# Patient Record
Sex: Female | Born: 1945 | Race: White | Hispanic: No | Marital: Married | Smoking: Former smoker
Health system: Southern US, Community
[De-identification: ages and names within clinical notes are randomized; demographics above are authoritative.]

## PROBLEM LIST (undated history)

## (undated) DIAGNOSIS — K219 Gastro-esophageal reflux disease without esophagitis: Secondary | ICD-10-CM

## (undated) DIAGNOSIS — N6310 Unspecified lump in the right breast, unspecified quadrant: Secondary | ICD-10-CM

## (undated) DIAGNOSIS — E789 Disorder of lipoprotein metabolism, unspecified: Secondary | ICD-10-CM

## (undated) DIAGNOSIS — E785 Hyperlipidemia, unspecified: Secondary | ICD-10-CM

## (undated) DIAGNOSIS — I10 Essential (primary) hypertension: Secondary | ICD-10-CM

## (undated) DIAGNOSIS — G2581 Restless legs syndrome: Secondary | ICD-10-CM

## (undated) DIAGNOSIS — F419 Anxiety disorder, unspecified: Secondary | ICD-10-CM

## (undated) DIAGNOSIS — R928 Other abnormal and inconclusive findings on diagnostic imaging of breast: Secondary | ICD-10-CM

## (undated) HISTORY — DX: Hyperlipidemia, unspecified: E78.5

## (undated) HISTORY — PX: TONSILLECTOMY: SUR1361

## (undated) HISTORY — PX: ABDOMINAL HYSTERECTOMY: SHX81

---

## 1993-07-19 HISTORY — PX: BREAST EXCISIONAL BIOPSY: SUR124

## 1998-10-17 ENCOUNTER — Other Ambulatory Visit: Admission: RE | Admit: 1998-10-17 | Discharge: 1998-10-17 | Payer: Self-pay | Admitting: *Deleted

## 1998-10-29 ENCOUNTER — Other Ambulatory Visit: Admission: RE | Admit: 1998-10-29 | Discharge: 1998-10-29 | Payer: Self-pay | Admitting: *Deleted

## 1999-04-23 ENCOUNTER — Ambulatory Visit (HOSPITAL_COMMUNITY): Admission: RE | Admit: 1999-04-23 | Discharge: 1999-04-23 | Payer: Self-pay | Admitting: *Deleted

## 1999-06-09 ENCOUNTER — Ambulatory Visit (HOSPITAL_COMMUNITY): Admission: RE | Admit: 1999-06-09 | Discharge: 1999-06-09 | Payer: Self-pay | Admitting: Gastroenterology

## 1999-06-09 ENCOUNTER — Encounter (INDEPENDENT_AMBULATORY_CARE_PROVIDER_SITE_OTHER): Payer: Self-pay | Admitting: *Deleted

## 2000-01-19 ENCOUNTER — Other Ambulatory Visit: Admission: RE | Admit: 2000-01-19 | Discharge: 2000-01-19 | Payer: Self-pay | Admitting: *Deleted

## 2001-04-17 ENCOUNTER — Ambulatory Visit (HOSPITAL_COMMUNITY): Admission: RE | Admit: 2001-04-17 | Discharge: 2001-04-17 | Payer: Self-pay | Admitting: Internal Medicine

## 2001-04-17 ENCOUNTER — Encounter: Payer: Self-pay | Admitting: Internal Medicine

## 2004-01-01 ENCOUNTER — Ambulatory Visit (HOSPITAL_COMMUNITY): Admission: RE | Admit: 2004-01-01 | Discharge: 2004-01-01 | Payer: Self-pay | Admitting: Internal Medicine

## 2004-10-16 ENCOUNTER — Encounter (INDEPENDENT_AMBULATORY_CARE_PROVIDER_SITE_OTHER): Payer: Self-pay | Admitting: *Deleted

## 2004-10-16 ENCOUNTER — Ambulatory Visit (HOSPITAL_COMMUNITY): Admission: RE | Admit: 2004-10-16 | Discharge: 2004-10-16 | Payer: Self-pay | Admitting: Gastroenterology

## 2006-09-08 ENCOUNTER — Other Ambulatory Visit: Admission: RE | Admit: 2006-09-08 | Discharge: 2006-09-08 | Payer: Self-pay | Admitting: Family Medicine

## 2009-11-20 ENCOUNTER — Other Ambulatory Visit: Admission: RE | Admit: 2009-11-20 | Discharge: 2009-11-20 | Payer: Self-pay | Admitting: Internal Medicine

## 2009-11-20 ENCOUNTER — Ambulatory Visit (HOSPITAL_COMMUNITY): Admission: RE | Admit: 2009-11-20 | Discharge: 2009-11-20 | Payer: Self-pay | Admitting: Internal Medicine

## 2009-11-20 ENCOUNTER — Encounter: Payer: Self-pay | Admitting: Cardiology

## 2009-12-12 ENCOUNTER — Ambulatory Visit: Payer: Self-pay | Admitting: Cardiology

## 2009-12-12 DIAGNOSIS — E782 Mixed hyperlipidemia: Secondary | ICD-10-CM | POA: Insufficient documentation

## 2009-12-12 DIAGNOSIS — R0989 Other specified symptoms and signs involving the circulatory and respiratory systems: Secondary | ICD-10-CM

## 2009-12-12 DIAGNOSIS — R0609 Other forms of dyspnea: Secondary | ICD-10-CM

## 2009-12-12 DIAGNOSIS — R5383 Other fatigue: Secondary | ICD-10-CM

## 2009-12-24 ENCOUNTER — Encounter: Payer: Self-pay | Admitting: Cardiology

## 2009-12-29 ENCOUNTER — Telehealth: Payer: Self-pay | Admitting: Cardiology

## 2010-01-22 ENCOUNTER — Ambulatory Visit: Payer: Self-pay | Admitting: Cardiology

## 2010-08-08 ENCOUNTER — Encounter: Payer: Self-pay | Admitting: Internal Medicine

## 2010-08-20 NOTE — Letter (Signed)
[facility]   Imported By: Marylou Mccoy 01/10/2010 09:29:42  _____________________________________________________________________  External Attachment:    Type:   Image     Comment:   External Document

## 2010-08-20 NOTE — Assessment & Plan Note (Signed)
Summary: np6/dx: eval for stress test or treadmill for SOB/lg   Referring Provider:  Loree Fee Primary Provider:  Dr. Oneta Rack  CC:  pt reports fatigue and a lot of stress.  Father has dementia and brother living with father is an alcoholic so patient is having to manage two households.  Pt states she has not taken her bp meds today.  Marland Kitchen  History of Present Illness: 65 yo with history of HTN and hyperlipidemia presents for evaluation of fatigue and abnormal ECG.  She has been significantly fatigued in the afternoon for the last 2 years.  She had some atypical chest heaviness last year but has not had any recent chest pain or heaviness.  She did have an episode of left arm pain that occurred while sitting down about 2 weeks ago.  This has not recurred.  Her main complaint is really just the fatigue.  She does not get short of breath with exertion in particular.  She golfs and gardens.  No palpitations.  She has had a number of stressful issues going on in her family recently.  Her BP is 154/90 today but she has not taken any of her BP medications.    ECG: NSR at 52 with incomplete RBBB  Labs (5/11): HCT 37.9, K 4.5, creatinine 0.94, LDL 86, HDL 51, TGs 346 (but had eaten before labs were drawn), TSH normal  Current Medications (verified): 1)  Ibuprofen 800 Mg Tabs (Ibuprofen) .... As Needed For Pain 2)  Crestor 20 Mg Tabs (Rosuvastatin Calcium) .... Take One Half Tablet Every Evening 3)  Toprol Xl 100 Mg Xr24h-Tab (Metoprolol Succinate) .... Take One Half Tablet Once Daily 4)  Lorazepam 1 Mg Tabs (Lorazepam) .... As Needed For Anxiety 5)  Triamterene-Hctz 75-50 Mg Tabs (Triamterene-Hctz) .... One Tablet Daily 6)  Noritate 1 % Crea (Metronidazole) .... As Needed 7)  Lunesta 2 Mg Tabs (Eszopiclone) .... Take As Needed For Sleep 8)  Hyoscyamine Sulfate 0.125 Mg/65ml Elix (Hyoscyamine Sulfate) .... As Needed 9)  Citalopram Hydrobromide 20 Mg Tabs (Citalopram Hydrobromide) .... Take One Tablet  Once Daily 10)  Multivitamins  Tabs (Multiple Vitamin) .... Once Daily 11)  Vitamin D3 1000 Unit Tabs (Cholecalciferol) .... Take One Tablet Once Daily 12)  Fish Oil Maximum Strength 1200 Mg Caps (Omega-3 Fatty Acids) .... Once Daily 13)  Magnesium 500 Mg Tabs (Magnesium) .... Once Daily 14)  Aspirin 81 Mg Tabs (Aspirin) .... Once Daily 15)  Pepcid Ac Maximum Strength 20 Mg Tabs (Famotidine) .... As Needed 16)  Dexilant 60 Mg Cpdr (Dexlansoprazole) .... Once Daily 17)  Cetirizine Hcl 10 Mg Tabs (Cetirizine Hcl) .... Take One Tablet Once Daily  Allergies (verified): No Known Drug Allergies  Past History:  Past Medical History: 1. Hyperlipidemia 2. HTN 3. GERD 4. IBS 5. Depression 6. Fatigue  Family History: Mother died at 31 of breast cancer 2 uncles with CABG in their 51s-70s  Social History: Married, lives in [place], retired, nonsmoker, rare ETOH  Review of Systems       All systems reviewed and negative except as per HPI.   Vital Signs:  Patient profile:   65 year old female Height:      64 inches Weight:      172 pounds BMI:     29.63 Pulse rate:   52 / minute Pulse rhythm:   regular BP sitting:   154 / 90  (left arm) Cuff size:   large  Vitals Entered By: Judithe Modest CMA (Dec 12, 2009 9:08 AM)  Physical Exam  General:  Well developed, well nourished, in no acute distress. Head:  normocephalic and atraumatic Nose:  no deformity, discharge, inflammation, or lesions Mouth:  Teeth, gums and palate normal. Oral mucosa normal. Neck:  Neck supple, no JVD. No masses, thyromegaly or abnormal cervical nodes. Lungs:  Clear bilaterally to auscultation and percussion. Heart:  Non-displaced PMI, chest non-tender; regular rate and rhythm, S1, S2 without murmurs, rubs or gallops. Carotid upstroke normal, no bruit. Pedals normal pulses. No edema, no varicosities. Abdomen:  Bowel sounds positive; abdomen soft and non-tender without masses, organomegaly, or hernias noted.  No hepatosplenomegaly. Msk:  Back normal, normal gait. Muscle strength and tone normal. Extremities:  No clubbing or cyanosis. Neurologic:  Alert and oriented x 3. Skin:  Intact without lesions or rashes. Psych:  Normal affect.   Impression & Recommendations:  Problem # 1:  FATIGUE (ICD-780.79) Patient reports significant fatigue in the afternoon for about 2 years.  No chest pain or pressure, no significant dyspnea.  Her TSH and HCT were normal.  She does have some cardiac risk factors (HTN and hyperlipidemia).  I will have her do an ETT to assess for ischemia as a cause of her symptoms.  She does report significant snoring at night, so I also recommended to her that she get a sleep study (I will set this up).   Problem # 2:  HYPERLIPIDEMIA-MIXED (ICD-272.4) Triglycerides were high but she had eaten before the last labs were done.  LDL and HDL at goal on Crestor.  I asked her to increase her fish oil to 2000 mg daily.   Other Orders: Misc. Referral (Misc. Ref) Treadmill (Treadmill)  Patient Instructions: 1)  Your physician has requested that you have an exercise tolerance test.  For further information please visit [email or web address]  Please also follow instruction sheet, as given. 2)  Your physician has recommended that you have a sleep study.  This test records several body functions during sleep, including:  brain activity, eye movement, oxygen and carbon dioxide blood levels, heart rate and rhythm, breathing rate and rhythm, the flow of air through your mouth and nose, snoring, body muscle movements, and chest and belly movement. 3)  Increase Fish Oil to one capsule twice a day.

## 2010-08-20 NOTE — Progress Notes (Signed)
Summary: decided not to have sleep study  ---- Converted from flag ---- ---- 12/25/2009 8:50 AM, Merita Norton Lloyd-Fate wrote: Pt has decided not to have sleep study [facility] Lloyd-Fate  December 25, 2009 8:47 AM ------------------------------

## 2010-08-20 NOTE — Progress Notes (Signed)
----   Converted from flag ---- ---- 12/23/2009 11:24 AM, Merita Norton Lloyd-Fate wrote: Pt has not returned any the sleep center phone calls per Denee. Merita Norton Lloyd-Fate  December 23, 2009 11:24 AM ------------------------------

## 2010-08-20 NOTE — Letter (Signed)
Summary: [facility]olescent Wellness Visit   [facility] Visit   Imported By: Roderic Ovens 01/22/2010 13:44:28  _____________________________________________________________________  External Attachment:    Type:   Image     Comment:   External Document

## 2010-12-04 NOTE — Op Note (Signed)
[name], [name]              ACCOUNT NO.:  [id number]   MEDICAL RECORD NO.:  [id number]          PATIENT TYPE:  AMB   LOCATION:  ENDO                         FACILITY:  [facility]   PHYSICIAN:  Petra Kuba, M.D.    DATE OF BIRTH:  February 21, 1946   DATE OF PROCEDURE:  10/16/2004  DATE OF DISCHARGE:                                 OPERATIVE REPORT   PROCEDURE:  Colonoscopy with hot biopsy.   INDICATIONS:  History of colon polyps.  Due for repeat screening.   Consent was signed after risks, benefits, methods, and options thoroughly  discussed multiple times in the past.   MEDICINES USED:  Demerol 70, Versed 9.   PROCEDURE:  Rectal inspection is pertinent for external hemorrhoids, small.  Digital exam was negative.  The video colonoscope was inserted and easily  able to be advanced around the colon to the cecum.  This did not require any  abdominal pressure or position changes.  No abnormalities was seen on  insertion.  The cecum was identified by the appendiceal orifice and the  ileocecal valve.  The scope inserted shortways into the terminal ileum,  which was normal.  Photo documentation was obtained.  The scope was slowly  withdrawn.  The prep was adequate.  There was minimal liquid stool that  required washing and suctioning.  On slow withdrawal back to the distal  sigmoid, no abnormalities were seen.  Specifically, no polyps, tumors,  masses, or diverticula.  In the distal sigmoid and rectum, two tiny polyps  were seen. Both were hot-biopsied and put in the same container.  Anorectal  pull-through and retroflexion confirmed some tiny hemorrhoids.  The scope  was straightened and readvanced shortways up the left side of the colon.  Air was suctioned.  The scope removed.  The patient tolerated the procedure  well.  There was no obvious immediate complication.   ENDOSCOPIC DIAGNOSES:  1.  Internal/external hemorrhoids.  2.  Two tiny rectal distal polyps, hot biopsied.  3.   Otherwise within normal limits in the cecum and terminal ileum.   PLAN:  Await pathology.  Probably recheck colon screening in five years.  Happy to see back p.r.n.  Otherwise yearly rectals and guaiacs per Dr.  Oneta Rack.     MEM/MEDQ  D:  10/16/2004  T:  10/16/2004  Job:  [id number]   cc:   Lucky Cowboy, M.D.  [address], [address]  [place], [place] [address]  Fax: [phone]

## 2010-12-04 NOTE — Procedures (Signed)
[place] [place] [facility]  Patient:    Tara [name]                      MRN: [id number] Proc. Date: 06/09/99 Adm. Date:  [id number] Attending:  Nelda Marseille CC:         Petra Kuba, M.D.             Amil Amen L. Lenon Ahmadi, M.D.                           Procedure Report  PROCEDURE:  Colonoscopy with hot biopsy.  INDICATIONS:  Patient with history of colon polyps due for repeat screening. Consent was signed after risks, benefits, methods and options thoroughly discussed multiple times in the past.  MEDICATIONS:  Demerol 75, Versed 7.5.  PROCEDURE:  Rectal inspection pertinent for small external hemorrhoids, small.  Digital examination was negative.  The video colonoscope was inserted and easily advanced around the colon to the cecum.  Cecum was identified by the appendiceal orifice and ileocecal valve.  In fact, the scope inserted a short ways into the  terminal ileum, which was normal.  Photo documentation was obtained.  The scope is slowly withdrawn.  Prep was adequate.  There was some liquid stool that required washing and suctioning.  On slow withdrawal through the colon, four tiny polyps  were seen and all hot biopsied x 1 and they were in the ascending, splenic flexure, descending and sigmoid.  No other abnormalities were seen.  Specifically, no diverticuli, signs of colitis or other worrisome lesions.  Back in the rectum, he scope is retroflexed, pertinent for some internal hemorrhoids.  Scope was drained, readvanced a short ways up the sigmoid, air was suctioned, scope was slowly withdrawn.  The patient tolerated the procedure well.  There were no obvious immediate complication.  ENDOSCOPIC DIAGNOSIS: 1. Internal and external hemorrhoids. 2. Four tiny polyps, all hot biopsied in the sigmoid, descending, splenic flexure    and ascending. 3. Otherwise within normal limits to the terminal ileum.  PLAN:  Await pathology to determine  future colonic screening. GI follow-up p.r.n., particularly if irritable bowel symptoms increase, otherwise yearly rectals and  guaiacs per Dr. Lenon Ahmadi. DD:  06/09/99 TD:  06/10/99 Job: [id number]

## 2011-08-26 DIAGNOSIS — J069 Acute upper respiratory infection, unspecified: Secondary | ICD-10-CM | POA: Diagnosis not present

## 2011-09-06 DIAGNOSIS — B0233 Zoster keratitis: Secondary | ICD-10-CM | POA: Diagnosis not present

## 2011-09-28 DIAGNOSIS — M25559 Pain in unspecified hip: Secondary | ICD-10-CM | POA: Diagnosis not present

## 2011-09-28 DIAGNOSIS — M25519 Pain in unspecified shoulder: Secondary | ICD-10-CM | POA: Diagnosis not present

## 2011-09-28 DIAGNOSIS — M25549 Pain in joints of unspecified hand: Secondary | ICD-10-CM | POA: Diagnosis not present

## 2011-11-15 DIAGNOSIS — Z09 Encounter for follow-up examination after completed treatment for conditions other than malignant neoplasm: Secondary | ICD-10-CM | POA: Diagnosis not present

## 2011-11-15 DIAGNOSIS — N6009 Solitary cyst of unspecified breast: Secondary | ICD-10-CM | POA: Diagnosis not present

## 2012-04-25 DIAGNOSIS — Z23 Encounter for immunization: Secondary | ICD-10-CM | POA: Diagnosis not present

## 2012-04-25 DIAGNOSIS — Z79899 Other long term (current) drug therapy: Secondary | ICD-10-CM | POA: Diagnosis not present

## 2012-04-25 DIAGNOSIS — E785 Hyperlipidemia, unspecified: Secondary | ICD-10-CM | POA: Diagnosis not present

## 2012-06-12 DIAGNOSIS — D2370 Other benign neoplasm of skin of unspecified lower limb, including hip: Secondary | ICD-10-CM | POA: Diagnosis not present

## 2012-06-12 DIAGNOSIS — D1801 Hemangioma of skin and subcutaneous tissue: Secondary | ICD-10-CM | POA: Diagnosis not present

## 2012-06-12 DIAGNOSIS — L821 Other seborrheic keratosis: Secondary | ICD-10-CM | POA: Diagnosis not present

## 2012-08-25 DIAGNOSIS — E785 Hyperlipidemia, unspecified: Secondary | ICD-10-CM | POA: Diagnosis not present

## 2012-10-24 DIAGNOSIS — I10 Essential (primary) hypertension: Secondary | ICD-10-CM | POA: Diagnosis not present

## 2013-01-16 DIAGNOSIS — N6019 Diffuse cystic mastopathy of unspecified breast: Secondary | ICD-10-CM | POA: Diagnosis not present

## 2013-02-05 DIAGNOSIS — H2510 Age-related nuclear cataract, unspecified eye: Secondary | ICD-10-CM | POA: Diagnosis not present

## 2013-03-15 DIAGNOSIS — J019 Acute sinusitis, unspecified: Secondary | ICD-10-CM | POA: Diagnosis not present

## 2013-06-28 DIAGNOSIS — I10 Essential (primary) hypertension: Secondary | ICD-10-CM | POA: Diagnosis not present

## 2013-06-28 DIAGNOSIS — Z23 Encounter for immunization: Secondary | ICD-10-CM | POA: Diagnosis not present

## 2013-10-05 DIAGNOSIS — N3942 Incontinence without sensory awareness: Secondary | ICD-10-CM | POA: Diagnosis not present

## 2013-10-05 DIAGNOSIS — N3946 Mixed incontinence: Secondary | ICD-10-CM | POA: Diagnosis not present

## 2013-10-05 DIAGNOSIS — R351 Nocturia: Secondary | ICD-10-CM | POA: Diagnosis not present

## 2013-11-13 DIAGNOSIS — N3946 Mixed incontinence: Secondary | ICD-10-CM | POA: Diagnosis not present

## 2013-11-13 DIAGNOSIS — N3942 Incontinence without sensory awareness: Secondary | ICD-10-CM | POA: Diagnosis not present

## 2013-11-20 DIAGNOSIS — F411 Generalized anxiety disorder: Secondary | ICD-10-CM | POA: Diagnosis not present

## 2013-11-20 DIAGNOSIS — R609 Edema, unspecified: Secondary | ICD-10-CM | POA: Diagnosis not present

## 2013-11-20 DIAGNOSIS — N318 Other neuromuscular dysfunction of bladder: Secondary | ICD-10-CM | POA: Diagnosis not present

## 2013-11-20 DIAGNOSIS — K219 Gastro-esophageal reflux disease without esophagitis: Secondary | ICD-10-CM | POA: Diagnosis not present

## 2013-11-20 DIAGNOSIS — G2581 Restless legs syndrome: Secondary | ICD-10-CM | POA: Diagnosis not present

## 2013-11-20 DIAGNOSIS — I10 Essential (primary) hypertension: Secondary | ICD-10-CM | POA: Diagnosis not present

## 2013-11-20 DIAGNOSIS — E785 Hyperlipidemia, unspecified: Secondary | ICD-10-CM | POA: Diagnosis not present

## 2013-11-20 DIAGNOSIS — Z23 Encounter for immunization: Secondary | ICD-10-CM | POA: Diagnosis not present

## 2014-01-14 DIAGNOSIS — E785 Hyperlipidemia, unspecified: Secondary | ICD-10-CM | POA: Diagnosis not present

## 2014-01-14 DIAGNOSIS — G2581 Restless legs syndrome: Secondary | ICD-10-CM | POA: Diagnosis not present

## 2014-01-14 DIAGNOSIS — F341 Dysthymic disorder: Secondary | ICD-10-CM | POA: Diagnosis not present

## 2014-01-14 DIAGNOSIS — I10 Essential (primary) hypertension: Secondary | ICD-10-CM | POA: Diagnosis not present

## 2014-03-21 DIAGNOSIS — Z1231 Encounter for screening mammogram for malignant neoplasm of breast: Secondary | ICD-10-CM | POA: Diagnosis not present

## 2014-03-21 DIAGNOSIS — Z803 Family history of malignant neoplasm of breast: Secondary | ICD-10-CM | POA: Diagnosis not present

## 2014-04-18 DIAGNOSIS — H2513 Age-related nuclear cataract, bilateral: Secondary | ICD-10-CM | POA: Diagnosis not present

## 2014-04-22 DIAGNOSIS — Z23 Encounter for immunization: Secondary | ICD-10-CM | POA: Diagnosis not present

## 2014-04-23 DIAGNOSIS — Z23 Encounter for immunization: Secondary | ICD-10-CM | POA: Diagnosis not present

## 2014-05-21 DIAGNOSIS — Z23 Encounter for immunization: Secondary | ICD-10-CM | POA: Diagnosis not present

## 2014-05-21 DIAGNOSIS — E6609 Other obesity due to excess calories: Secondary | ICD-10-CM | POA: Diagnosis not present

## 2014-05-21 DIAGNOSIS — I10 Essential (primary) hypertension: Secondary | ICD-10-CM | POA: Diagnosis not present

## 2014-05-21 DIAGNOSIS — E785 Hyperlipidemia, unspecified: Secondary | ICD-10-CM | POA: Diagnosis not present

## 2014-05-21 DIAGNOSIS — G2581 Restless legs syndrome: Secondary | ICD-10-CM | POA: Diagnosis not present

## 2014-05-21 DIAGNOSIS — J309 Allergic rhinitis, unspecified: Secondary | ICD-10-CM | POA: Diagnosis not present

## 2014-05-21 DIAGNOSIS — F324 Major depressive disorder, single episode, in partial remission: Secondary | ICD-10-CM | POA: Diagnosis not present

## 2014-05-21 DIAGNOSIS — F419 Anxiety disorder, unspecified: Secondary | ICD-10-CM | POA: Diagnosis not present

## 2014-08-15 DIAGNOSIS — H2513 Age-related nuclear cataract, bilateral: Secondary | ICD-10-CM | POA: Diagnosis not present

## 2014-08-15 DIAGNOSIS — H2512 Age-related nuclear cataract, left eye: Secondary | ICD-10-CM | POA: Diagnosis not present

## 2014-08-21 DIAGNOSIS — H2512 Age-related nuclear cataract, left eye: Secondary | ICD-10-CM | POA: Diagnosis not present

## 2014-10-08 DIAGNOSIS — L919 Hypertrophic disorder of the skin, unspecified: Secondary | ICD-10-CM | POA: Diagnosis not present

## 2014-10-08 DIAGNOSIS — L718 Other rosacea: Secondary | ICD-10-CM | POA: Diagnosis not present

## 2014-10-08 DIAGNOSIS — L821 Other seborrheic keratosis: Secondary | ICD-10-CM | POA: Diagnosis not present

## 2014-10-08 DIAGNOSIS — L820 Inflamed seborrheic keratosis: Secondary | ICD-10-CM | POA: Diagnosis not present

## 2014-10-08 DIAGNOSIS — D1801 Hemangioma of skin and subcutaneous tissue: Secondary | ICD-10-CM | POA: Diagnosis not present

## 2014-10-10 DIAGNOSIS — E785 Hyperlipidemia, unspecified: Secondary | ICD-10-CM | POA: Diagnosis not present

## 2014-10-10 DIAGNOSIS — E6609 Other obesity due to excess calories: Secondary | ICD-10-CM | POA: Diagnosis not present

## 2014-10-10 DIAGNOSIS — G2581 Restless legs syndrome: Secondary | ICD-10-CM | POA: Diagnosis not present

## 2014-10-10 DIAGNOSIS — F419 Anxiety disorder, unspecified: Secondary | ICD-10-CM | POA: Diagnosis not present

## 2014-10-10 DIAGNOSIS — Z6833 Body mass index (BMI) 33.0-33.9, adult: Secondary | ICD-10-CM | POA: Diagnosis not present

## 2014-10-10 DIAGNOSIS — R3915 Urgency of urination: Secondary | ICD-10-CM | POA: Diagnosis not present

## 2014-10-10 DIAGNOSIS — I10 Essential (primary) hypertension: Secondary | ICD-10-CM | POA: Diagnosis not present

## 2014-10-10 DIAGNOSIS — F324 Major depressive disorder, single episode, in partial remission: Secondary | ICD-10-CM | POA: Diagnosis not present

## 2014-10-10 DIAGNOSIS — Z0000 Encounter for general adult medical examination without abnormal findings: Secondary | ICD-10-CM | POA: Diagnosis not present

## 2014-10-21 DIAGNOSIS — S60211A Contusion of right wrist, initial encounter: Secondary | ICD-10-CM | POA: Diagnosis not present

## 2015-02-14 DIAGNOSIS — R42 Dizziness and giddiness: Secondary | ICD-10-CM | POA: Diagnosis not present

## 2015-02-14 DIAGNOSIS — R12 Heartburn: Secondary | ICD-10-CM | POA: Diagnosis not present

## 2015-02-14 DIAGNOSIS — R0602 Shortness of breath: Secondary | ICD-10-CM | POA: Diagnosis not present

## 2015-02-14 DIAGNOSIS — I10 Essential (primary) hypertension: Secondary | ICD-10-CM | POA: Diagnosis not present

## 2015-04-10 DIAGNOSIS — Z961 Presence of intraocular lens: Secondary | ICD-10-CM | POA: Diagnosis not present

## 2015-04-14 DIAGNOSIS — F419 Anxiety disorder, unspecified: Secondary | ICD-10-CM | POA: Diagnosis not present

## 2015-04-14 DIAGNOSIS — F324 Major depressive disorder, single episode, in partial remission: Secondary | ICD-10-CM | POA: Diagnosis not present

## 2015-04-14 DIAGNOSIS — N3281 Overactive bladder: Secondary | ICD-10-CM | POA: Diagnosis not present

## 2015-04-14 DIAGNOSIS — I10 Essential (primary) hypertension: Secondary | ICD-10-CM | POA: Diagnosis not present

## 2015-04-14 DIAGNOSIS — N898 Other specified noninflammatory disorders of vagina: Secondary | ICD-10-CM | POA: Diagnosis not present

## 2015-04-14 DIAGNOSIS — E785 Hyperlipidemia, unspecified: Secondary | ICD-10-CM | POA: Diagnosis not present

## 2015-04-14 DIAGNOSIS — E6609 Other obesity due to excess calories: Secondary | ICD-10-CM | POA: Diagnosis not present

## 2015-04-14 DIAGNOSIS — M549 Dorsalgia, unspecified: Secondary | ICD-10-CM | POA: Diagnosis not present

## 2015-04-14 DIAGNOSIS — Z23 Encounter for immunization: Secondary | ICD-10-CM | POA: Diagnosis not present

## 2015-06-07 DIAGNOSIS — Z803 Family history of malignant neoplasm of breast: Secondary | ICD-10-CM | POA: Diagnosis not present

## 2015-06-07 DIAGNOSIS — Z1231 Encounter for screening mammogram for malignant neoplasm of breast: Secondary | ICD-10-CM | POA: Diagnosis not present

## 2015-06-11 DIAGNOSIS — R922 Inconclusive mammogram: Secondary | ICD-10-CM | POA: Diagnosis not present

## 2015-06-11 DIAGNOSIS — R921 Mammographic calcification found on diagnostic imaging of breast: Secondary | ICD-10-CM | POA: Diagnosis not present

## 2015-06-11 DIAGNOSIS — Z1231 Encounter for screening mammogram for malignant neoplasm of breast: Secondary | ICD-10-CM | POA: Diagnosis not present

## 2015-06-11 DIAGNOSIS — Z803 Family history of malignant neoplasm of breast: Secondary | ICD-10-CM | POA: Diagnosis not present

## 2015-10-24 ENCOUNTER — Other Ambulatory Visit: Payer: Self-pay | Admitting: Gastroenterology

## 2015-10-24 DIAGNOSIS — K621 Rectal polyp: Secondary | ICD-10-CM | POA: Diagnosis not present

## 2015-10-24 DIAGNOSIS — K635 Polyp of colon: Secondary | ICD-10-CM | POA: Diagnosis not present

## 2015-10-24 DIAGNOSIS — D120 Benign neoplasm of cecum: Secondary | ICD-10-CM | POA: Diagnosis not present

## 2015-10-24 DIAGNOSIS — D122 Benign neoplasm of ascending colon: Secondary | ICD-10-CM | POA: Diagnosis not present

## 2015-10-24 DIAGNOSIS — Z86010 Personal history of colonic polyps: Secondary | ICD-10-CM | POA: Diagnosis not present

## 2015-10-24 DIAGNOSIS — D125 Benign neoplasm of sigmoid colon: Secondary | ICD-10-CM | POA: Diagnosis not present

## 2015-11-13 DIAGNOSIS — Z0000 Encounter for general adult medical examination without abnormal findings: Secondary | ICD-10-CM | POA: Diagnosis not present

## 2015-11-13 DIAGNOSIS — Z1159 Encounter for screening for other viral diseases: Secondary | ICD-10-CM | POA: Diagnosis not present

## 2015-11-13 DIAGNOSIS — M546 Pain in thoracic spine: Secondary | ICD-10-CM | POA: Diagnosis not present

## 2015-11-13 DIAGNOSIS — R5383 Other fatigue: Secondary | ICD-10-CM | POA: Diagnosis not present

## 2015-11-13 DIAGNOSIS — I10 Essential (primary) hypertension: Secondary | ICD-10-CM | POA: Diagnosis not present

## 2015-11-13 DIAGNOSIS — Z13820 Encounter for screening for osteoporosis: Secondary | ICD-10-CM | POA: Diagnosis not present

## 2015-11-13 DIAGNOSIS — E785 Hyperlipidemia, unspecified: Secondary | ICD-10-CM | POA: Diagnosis not present

## 2015-11-13 DIAGNOSIS — F324 Major depressive disorder, single episode, in partial remission: Secondary | ICD-10-CM | POA: Diagnosis not present

## 2015-11-13 DIAGNOSIS — G2581 Restless legs syndrome: Secondary | ICD-10-CM | POA: Diagnosis not present

## 2015-11-19 DIAGNOSIS — S30860A Insect bite (nonvenomous) of lower back and pelvis, initial encounter: Secondary | ICD-10-CM | POA: Diagnosis not present

## 2015-11-19 DIAGNOSIS — R5381 Other malaise: Secondary | ICD-10-CM | POA: Diagnosis not present

## 2015-11-19 DIAGNOSIS — S2096XA Insect bite (nonvenomous) of unspecified parts of thorax, initial encounter: Secondary | ICD-10-CM | POA: Diagnosis not present

## 2015-11-19 LAB — CBC AND DIFFERENTIAL
HEMATOCRIT: 40 (ref 36–46)
Hemoglobin: 13 (ref 12.0–16.0)
Neutrophils Absolute: 3
Platelets: 198 (ref 150–399)
WBC: 5.9

## 2016-02-12 DIAGNOSIS — E785 Hyperlipidemia, unspecified: Secondary | ICD-10-CM | POA: Diagnosis not present

## 2016-02-12 DIAGNOSIS — Z0000 Encounter for general adult medical examination without abnormal findings: Secondary | ICD-10-CM | POA: Diagnosis not present

## 2016-02-12 LAB — LIPID PANEL
Cholesterol: 217 — AB (ref 0–200)
HDL: 53 (ref 35–70)
LDL CALC: 118
Triglycerides: 229 — AB (ref 40–160)

## 2016-02-12 LAB — BASIC METABOLIC PANEL
BUN: 16 (ref 4–21)
Creatinine: 0.9 (ref 0.5–1.1)
GLUCOSE: 93
Potassium: 4.5 (ref 3.4–5.3)
SODIUM: 140 (ref 137–147)

## 2016-02-12 LAB — TSH: TSH: 1.77 (ref 0.41–5.90)

## 2016-02-12 LAB — HEPATIC FUNCTION PANEL
ALK PHOS: 76 (ref 25–125)
ALT: 23 (ref 7–35)
AST: 18 (ref 13–35)
BILIRUBIN, TOTAL: 0.4

## 2016-02-17 ENCOUNTER — Other Ambulatory Visit: Payer: Self-pay | Admitting: Family Medicine

## 2016-02-17 DIAGNOSIS — E2839 Other primary ovarian failure: Secondary | ICD-10-CM

## 2016-03-02 ENCOUNTER — Other Ambulatory Visit: Payer: Self-pay | Admitting: Family Medicine

## 2016-03-02 DIAGNOSIS — N6002 Solitary cyst of left breast: Secondary | ICD-10-CM

## 2016-03-08 ENCOUNTER — Other Ambulatory Visit: Payer: Self-pay

## 2016-03-08 ENCOUNTER — Other Ambulatory Visit: Payer: Self-pay | Admitting: Family Medicine

## 2016-03-08 DIAGNOSIS — E2839 Other primary ovarian failure: Secondary | ICD-10-CM

## 2016-03-08 DIAGNOSIS — N6002 Solitary cyst of left breast: Secondary | ICD-10-CM

## 2016-03-09 ENCOUNTER — Ambulatory Visit
Admission: RE | Admit: 2016-03-09 | Discharge: 2016-03-09 | Disposition: A | Discharge status: Home / Self Care | Payer: Medicare Other | Source: Ambulatory Visit | Attending: Family Medicine | Admitting: Family Medicine

## 2016-03-09 ENCOUNTER — Other Ambulatory Visit: Payer: Self-pay | Admitting: Family Medicine

## 2016-03-09 DIAGNOSIS — Z780 Asymptomatic menopausal state: Secondary | ICD-10-CM | POA: Diagnosis not present

## 2016-03-09 DIAGNOSIS — M85851 Other specified disorders of bone density and structure, right thigh: Secondary | ICD-10-CM | POA: Diagnosis not present

## 2016-03-09 DIAGNOSIS — N6002 Solitary cyst of left breast: Secondary | ICD-10-CM

## 2016-03-09 DIAGNOSIS — N6489 Other specified disorders of breast: Secondary | ICD-10-CM | POA: Diagnosis not present

## 2016-03-09 DIAGNOSIS — E2839 Other primary ovarian failure: Secondary | ICD-10-CM

## 2016-03-09 DIAGNOSIS — R928 Other abnormal and inconclusive findings on diagnostic imaging of breast: Secondary | ICD-10-CM

## 2016-03-15 ENCOUNTER — Ambulatory Visit
Admission: RE | Admit: 2016-03-15 | Discharge: 2016-03-15 | Disposition: A | Discharge status: Home / Self Care | Payer: Medicare Other | Source: Ambulatory Visit | Attending: Family Medicine | Admitting: Family Medicine

## 2016-03-15 ENCOUNTER — Other Ambulatory Visit: Payer: Self-pay | Admitting: Family Medicine

## 2016-03-15 DIAGNOSIS — N6489 Other specified disorders of breast: Secondary | ICD-10-CM | POA: Diagnosis not present

## 2016-03-15 DIAGNOSIS — R928 Other abnormal and inconclusive findings on diagnostic imaging of breast: Secondary | ICD-10-CM

## 2016-03-15 DIAGNOSIS — N6012 Diffuse cystic mastopathy of left breast: Secondary | ICD-10-CM | POA: Diagnosis not present

## 2016-03-15 HISTORY — PX: BREAST BIOPSY: SHX20

## 2016-03-23 ENCOUNTER — Other Ambulatory Visit: Payer: Self-pay | Admitting: General Surgery

## 2016-03-23 DIAGNOSIS — R928 Other abnormal and inconclusive findings on diagnostic imaging of breast: Secondary | ICD-10-CM | POA: Diagnosis not present

## 2016-03-23 DIAGNOSIS — Z9889 Other specified postprocedural states: Secondary | ICD-10-CM | POA: Diagnosis not present

## 2016-03-23 DIAGNOSIS — G2581 Restless legs syndrome: Secondary | ICD-10-CM | POA: Diagnosis not present

## 2016-03-23 DIAGNOSIS — Z803 Family history of malignant neoplasm of breast: Secondary | ICD-10-CM | POA: Diagnosis not present

## 2016-03-23 DIAGNOSIS — I10 Essential (primary) hypertension: Secondary | ICD-10-CM | POA: Diagnosis not present

## 2016-03-23 DIAGNOSIS — E785 Hyperlipidemia, unspecified: Secondary | ICD-10-CM | POA: Diagnosis not present

## 2016-03-25 ENCOUNTER — Encounter (HOSPITAL_BASED_OUTPATIENT_CLINIC_OR_DEPARTMENT_OTHER): Payer: Self-pay | Admitting: *Deleted

## 2016-03-25 ENCOUNTER — Other Ambulatory Visit: Payer: Self-pay | Admitting: General Surgery

## 2016-03-25 DIAGNOSIS — R928 Other abnormal and inconclusive findings on diagnostic imaging of breast: Secondary | ICD-10-CM

## 2016-03-30 ENCOUNTER — Encounter (HOSPITAL_BASED_OUTPATIENT_CLINIC_OR_DEPARTMENT_OTHER)
Admission: RE | Admit: 2016-03-30 | Discharge: 2016-03-30 | Disposition: A | Discharge status: Home / Self Care | Payer: Medicare Other | Source: Ambulatory Visit | Attending: General Surgery | Admitting: General Surgery

## 2016-03-30 ENCOUNTER — Other Ambulatory Visit: Payer: Self-pay

## 2016-03-30 DIAGNOSIS — I10 Essential (primary) hypertension: Secondary | ICD-10-CM | POA: Insufficient documentation

## 2016-03-30 LAB — COMPREHENSIVE METABOLIC PANEL
ALBUMIN: 4 g/dL (ref 3.5–5.0)
ALK PHOS: 61 U/L (ref 38–126)
ALT: 19 U/L (ref 14–54)
AST: 17 U/L (ref 15–41)
Anion gap: 6 (ref 5–15)
BILIRUBIN TOTAL: 0.6 mg/dL (ref 0.3–1.2)
BUN: 13 mg/dL (ref 6–20)
CALCIUM: 9.1 mg/dL (ref 8.9–10.3)
CO2: 25 mmol/L (ref 22–32)
CREATININE: 0.81 mg/dL (ref 0.44–1.00)
Chloride: 107 mmol/L (ref 101–111)
GFR calc Af Amer: >60 mL/min (ref >60)
GLUCOSE: 97 mg/dL (ref 65–99)
Potassium: 4.6 mmol/L (ref 3.5–5.1)
Sodium: 138 mmol/L (ref 135–145)
TOTAL PROTEIN: 6.5 g/dL (ref 6.5–8.1)

## 2016-03-30 LAB — CBC WITH DIFFERENTIAL/PLATELET
BASOS ABS: 0.1 10*3/uL (ref 0.0–0.1)
BASOS PCT: 1 %
Eosinophils Absolute: 0.3 10*3/uL (ref 0.0–0.7)
Eosinophils Relative: 5 %
HEMATOCRIT: 41.4 % (ref 36.0–46.0)
HEMOGLOBIN: 13.2 g/dL (ref 12.0–15.0)
LYMPHS PCT: 34 %
Lymphs Abs: 2.1 10*3/uL (ref 0.7–4.0)
MCH: 28.5 pg (ref 26.0–34.0)
MCHC: 31.9 g/dL (ref 30.0–36.0)
MCV: 89.4 fL (ref 78.0–100.0)
MONO ABS: 0.5 10*3/uL (ref 0.1–1.0)
Monocytes Relative: 9 %
NEUTROS ABS: 3.1 10*3/uL (ref 1.7–7.7)
NEUTROS PCT: 51 %
Platelets: 198 10*3/uL (ref 150–400)
RBC: 4.63 MIL/uL (ref 3.87–5.11)
RDW: 13.1 % (ref 11.5–15.5)
WBC: 6.1 10*3/uL (ref 4.0–10.5)

## 2016-03-30 NOTE — Progress Notes (Signed)
Pt given a 8 oz carton of boost breeze with verbal and written instructions to drink by 530 morning of surgery, teach back, and drink no other fluid after midnight. Pt voiced understanding

## 2016-04-19 ENCOUNTER — Ambulatory Visit
Admission: RE | Admit: 2016-04-19 | Discharge: 2016-04-19 | Disposition: A | Discharge status: Home / Self Care | Payer: Medicare Other | Source: Ambulatory Visit | Attending: General Surgery | Admitting: General Surgery

## 2016-04-19 DIAGNOSIS — R928 Other abnormal and inconclusive findings on diagnostic imaging of breast: Secondary | ICD-10-CM

## 2016-04-19 NOTE — H&P (Signed)
Tara [name]  Location: [facility] Patient #: [id number] DOB: 1946-04-06 Married / Language: English / Race: White Female       History of Present Illness The patient is a 70 year old female who presents with a complaint of Breast problems. This is a 70 year old female who presents for evaluation of complex sclerosing lesion of the left breast, upper outer quadrant.  The patient has a remote history of a left breast biopsy for benign cyst. Otherwise no breast problems in the past. On June 11, 2015 she had been evaluated at [facility] on a 5 mm cyst in the upper inner quadrant left breast and fail six-month follow-up.  Recent screening mammograms at [facility] showed a tiny palpable nodule in the upper inner quadrant which was stable but found a small 2 mm area of distortion in the superior left breast, 12:30 position, posterior depth. The old breast biopsy scar was medial. 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 They recommended excision.  I discussed the state of the literature with complex sclerosing lesion. I told her that incidence of upgrade to some type of malignancy was probably somewhere between 5 and 9%. That is the reason for excision. She understands that most likely she does not have cancer. She wants just area excised.  Comorbidities include hypertension, prior benign left breast biopsy, restless leg syndrome, hyperlipidemia. Family history reveals mother died of metastatic breast cancer at age 83, 2 years following diagnosis. Grandmother had colon cancer. No other breast cancer or ovarian cancer in the family. Socially she is married. No children. 2 step children. Denies tobacco. Has 2 alcoholic beverages a day.  She will be scheduled for left breast lumpectomy with radioactive seed localization. I discussed the indications, details, techniques, and numerous risk of the surgery with her. She is aware  of the risks of bleeding, infection, reoperation of cancer, cosmetic deformity, chronic pain from nerve damage and other unforeseen problems. She understands these issues well. All of her questions were answered. She agrees with this plan.   Other Problems Anxiety Disorder Arthritis Back Pain Depression Diverticulosis Gastroesophageal Reflux Disease Hemorrhoids Hypercholesterolemia Lump In Breast  Past Surgical History  Breast Biopsy Left. multiple Colon Polyp Removal - Colonoscopy Hysterectomy (not due to cancer) - Partial Oral Surgery Tonsillectomy  Diagnostic Studies Colonoscopy within last year Mammogram 1-3 years ago  Allergies  No Known Drug Allergies09/11/2015  Medication History  Atorvastatin Calcium (10MG  Tablet, Oral) Active. ClonazePAM (1MG  Tablet, Oral) Active. BuPROPion HCl ER (XL) (300MG  Tablet ER 24HR, Oral) Active. Atorvastatin Calcium (20MG  Tablet, Oral) Active. Metoprolol Succinate ER (100MG  Tablet ER 24HR, Oral) Active. Ibuprofen (800MG  Tablet, Oral) Active. Fish Oil + D3 (1000-1000MG -UNIT Capsule, Oral) Active. Aspirin (81MG  Tablet Chewable, Oral) Active. CoQ10 (10MG  Capsule, Oral) Active. Triamterene-HCTZ (75-50MG  Tablet, Oral) Active. Medications Reconciled  Social History Alcohol use Moderate alcohol use. No drug use Tobacco use Former smoker.  Family History  Alcohol Abuse Brother. Anesthetic complications Mother. Breast Cancer Mother. Colon Cancer Family Members In General. Depression Brother, Father. Heart disease in female family member before age 45 Hypertension Brother, Father. Ischemic Bowel Disease Father. Melanoma Sister.  Pregnancy / Birth History Age at menarche 42 years. Contraceptive History Oral contraceptives. Gravida 0 Para 0    Review of Systems  General Not Present- Appetite Loss, Chills, Fatigue, Fever, Night Sweats, Weight Gain and Weight Loss. Skin Present-  Dryness. Not Present- Change in Wart/Mole, Hives, Jaundice, New Lesions, Non-Healing Wounds, Rash and Ulcer. HEENT  Present- Ringing in the Ears, Seasonal Allergies and Wears glasses/contact lenses. Not Present- Earache, Hearing Loss, Hoarseness, Nose Bleed, Oral Ulcers, Sinus Pain, Sore Throat, Visual Disturbances and Yellow Eyes. Respiratory Present- Snoring. Not Present- Bloody sputum, Chronic Cough, Difficulty Breathing and Wheezing. Breast Present- Breast Mass. Not Present- Breast Pain, Nipple Discharge and Skin Changes. Cardiovascular Present- Swelling of Extremities. Not Present- Chest Pain, Difficulty Breathing Lying Down, Leg Cramps, Palpitations, Rapid Heart Rate and Shortness of Breath. Gastrointestinal Present- Bloating and Indigestion. Not Present- Abdominal Pain, Bloody Stool, Change in Bowel Habits, Chronic diarrhea, Constipation, Difficulty Swallowing, Excessive gas, Gets full quickly at meals, Hemorrhoids, Nausea, Rectal Pain and Vomiting. Female Genitourinary Present- Frequency and Urgency. Not Present- Nocturia, Painful Urination and Pelvic Pain. Neurological Present- Decreased Memory. Not Present- Fainting, Headaches, Numbness, Seizures, Tingling, Tremor, Trouble walking and Weakness. Psychiatric Present- Anxiety and Depression. Not Present- Bipolar, Change in Sleep Pattern, Fearful and Frequent crying. Endocrine Present- Heat Intolerance. Not Present- Cold Intolerance, Excessive Hunger, Hair Changes, Hot flashes and New Diabetes. Hematology Present- Easy Bruising. Not Present- Blood Thinners, Excessive bleeding, Gland problems, HIV and Persistent Infections.  Vitals  Weight: 188 lb Height: 60in Body Surface Area: 1.82 m Body Mass Index: 36.72 kg/m  Temp.: 98.49F(Temporal)  Pulse: 79 (Regular)  BP: 126/82 (Sitting, Left Arm, Standard)     Physical Exam General Mental Status-Alert. General Appearance-Consistent with stated age. Hydration-Well  hydrated. Voice-Normal.  Head and Neck Head-normocephalic, atraumatic with no lesions or palpable masses. Trachea-midline. Thyroid Gland Characteristics - normal size and consistency.  Eye Eyeball - Bilateral-Extraocular movements intact. Sclera/Conjunctiva - Bilateral-No scleral icterus.  Chest and Lung Exam Chest and lung exam reveals -quiet, even and easy respiratory effort with no use of accessory muscles and on auscultation, normal breath sounds, no adventitious sounds and normal vocal resonance. Inspection Chest Wall - Normal. Back - normal.  Breast Note: Breasts medium size. Well-healed curvilinear scar upper inner left breast. Tiny 2 mm subdermal nodule left breast at the 11:30 position. Minor ecchymoses left breast upper outer quadrant. Some tenderness there. No other masses in either breast. No other skin changes. No axillary adenopathy.   Cardiovascular Cardiovascular examination reveals -normal heart sounds, regular rate and rhythm with no murmurs and normal pedal pulses bilaterally.  Abdomen Inspection Inspection of the abdomen reveals - No Hernias. Skin - Scar - no surgical scars. Palpation/Percussion Palpation and Percussion of the abdomen reveal - Soft, Non Tender, No Rebound tenderness, No Rigidity (guarding) and No hepatosplenomegaly. Auscultation Auscultation of the abdomen reveals - Bowel sounds normal.  Neurologic Neurologic evaluation reveals -alert and oriented x 3 with no impairment of recent or remote memory. Mental Status-Normal.  Musculoskeletal Normal Exam - Left-Upper Extremity Strength Normal and Lower Extremity Strength Normal. Normal Exam - Right-Upper Extremity Strength Normal and Lower Extremity Strength Normal.  Lymphatic Head & Neck  General Head & Neck Lymphatics: Bilateral - Description - Normal. Axillary  General Axillary Region: Bilateral - Description - Normal. Tenderness - Non Tender. Femoral &  Inguinal  Generalized Femoral & Inguinal Lymphatics: Bilateral - Description - Normal. Tenderness - Non Tender.    Assessment & Plan  ABNORMAL MAMMOGRAM OF LEFT BREAST (R92.8)   Your recent imaging studies and biopsy showed a slightly atypical lesion in the left breast upper outer quadrant called a complex sclerosing lesion. Our literature and data suggested there is a 5-9% chance of upgrading this to a low-grade cancer. Most likely this is benign  You have a family history of breast cancer in your  mother  We have decided to go ahead and schedule you for left breast lumpectomy with radioactive seed localization I have discussed indications, techniques, and numerous risk of the surgery. We will schedule the surgery later this month after your family vacation  Please read the information booklet that we gave you.  Pt Education - Pamphlet Given - Breast Biopsy: discussed with patient and provided information. HYPERLIPIDEMIA, MILD (E78.5) HISTORY OF LEFT BREAST BIOPSY (Z98.890) HYPERTENSION, ESSENTIAL (I10) RESTLESS LEG SYNDROME (G25.81) FAMILY HISTORY OF BREAST CANCER IN FIRST DEGREE RELATIVE (Z80.3)   Anaika Santillano M. Dalbert Batman, M.D., [facility] General and Minimally invasive Surgery Breast and Colorectal Surgery Office:   [phone] Pager:   [phone]

## 2016-04-20 ENCOUNTER — Ambulatory Visit
Admission: RE | Admit: 2016-04-20 | Discharge: 2016-04-20 | Disposition: A | Discharge status: Home / Self Care | Payer: Medicare Other | Source: Ambulatory Visit | Attending: General Surgery | Admitting: General Surgery

## 2016-04-20 ENCOUNTER — Encounter (HOSPITAL_BASED_OUTPATIENT_CLINIC_OR_DEPARTMENT_OTHER): Payer: Self-pay

## 2016-04-20 ENCOUNTER — Ambulatory Visit (HOSPITAL_BASED_OUTPATIENT_CLINIC_OR_DEPARTMENT_OTHER): Payer: Medicare Other | Admitting: Certified Registered Nurse Anesthetist

## 2016-04-20 ENCOUNTER — Encounter (HOSPITAL_BASED_OUTPATIENT_CLINIC_OR_DEPARTMENT_OTHER)
Admission: RE | Disposition: A | Discharge status: Home / Self Care | Payer: Self-pay | Source: Ambulatory Visit | Attending: General Surgery

## 2016-04-20 ENCOUNTER — Ambulatory Visit (HOSPITAL_BASED_OUTPATIENT_CLINIC_OR_DEPARTMENT_OTHER)
Admission: RE | Admit: 2016-04-20 | Discharge: 2016-04-20 | Disposition: A | Discharge status: Home / Self Care | Payer: Medicare Other | Source: Ambulatory Visit | Attending: General Surgery | Admitting: General Surgery

## 2016-04-20 DIAGNOSIS — N6092 Unspecified benign mammary dysplasia of left breast: Secondary | ICD-10-CM | POA: Insufficient documentation

## 2016-04-20 DIAGNOSIS — N6082 Other benign mammary dysplasias of left breast: Secondary | ICD-10-CM | POA: Diagnosis not present

## 2016-04-20 DIAGNOSIS — G2581 Restless legs syndrome: Secondary | ICD-10-CM | POA: Insufficient documentation

## 2016-04-20 DIAGNOSIS — Z7982 Long term (current) use of aspirin: Secondary | ICD-10-CM | POA: Diagnosis not present

## 2016-04-20 DIAGNOSIS — Z79899 Other long term (current) drug therapy: Secondary | ICD-10-CM | POA: Diagnosis not present

## 2016-04-20 DIAGNOSIS — K219 Gastro-esophageal reflux disease without esophagitis: Secondary | ICD-10-CM | POA: Diagnosis not present

## 2016-04-20 DIAGNOSIS — Z8249 Family history of ischemic heart disease and other diseases of the circulatory system: Secondary | ICD-10-CM | POA: Diagnosis not present

## 2016-04-20 DIAGNOSIS — I10 Essential (primary) hypertension: Secondary | ICD-10-CM | POA: Diagnosis not present

## 2016-04-20 DIAGNOSIS — Z87891 Personal history of nicotine dependence: Secondary | ICD-10-CM | POA: Insufficient documentation

## 2016-04-20 DIAGNOSIS — E7800 Pure hypercholesterolemia, unspecified: Secondary | ICD-10-CM | POA: Diagnosis not present

## 2016-04-20 DIAGNOSIS — Z803 Family history of malignant neoplasm of breast: Secondary | ICD-10-CM | POA: Insufficient documentation

## 2016-04-20 DIAGNOSIS — N6489 Other specified disorders of breast: Secondary | ICD-10-CM | POA: Diagnosis not present

## 2016-04-20 DIAGNOSIS — Z90710 Acquired absence of both cervix and uterus: Secondary | ICD-10-CM | POA: Diagnosis not present

## 2016-04-20 DIAGNOSIS — R928 Other abnormal and inconclusive findings on diagnostic imaging of breast: Secondary | ICD-10-CM

## 2016-04-20 DIAGNOSIS — F419 Anxiety disorder, unspecified: Secondary | ICD-10-CM | POA: Diagnosis not present

## 2016-04-20 DIAGNOSIS — N6012 Diffuse cystic mastopathy of left breast: Secondary | ICD-10-CM | POA: Diagnosis not present

## 2016-04-20 HISTORY — PX: BREAST LUMPECTOMY WITH RADIOACTIVE SEED LOCALIZATION: SHX6424

## 2016-04-20 HISTORY — DX: Anxiety disorder, unspecified: F41.9

## 2016-04-20 HISTORY — DX: Essential (primary) hypertension: I10

## 2016-04-20 HISTORY — DX: Disorder of lipoprotein metabolism, unspecified: E78.9

## 2016-04-20 HISTORY — PX: BREAST EXCISIONAL BIOPSY: SUR124

## 2016-04-20 HISTORY — DX: Restless legs syndrome: G25.81

## 2016-04-20 HISTORY — DX: Other abnormal and inconclusive findings on diagnostic imaging of breast: R92.8

## 2016-04-20 SURGERY — BREAST LUMPECTOMY WITH RADIOACTIVE SEED LOCALIZATION
Anesthesia: General | Site: Breast | Laterality: Left

## 2016-04-20 MED ORDER — ATROPINE SULFATE 0.4 MG/ML IJ SOLN
INTRAMUSCULAR | Status: DC | PRN
  Administered 2016-04-20: 0.4 mg via INTRAVENOUS

## 2016-04-20 MED ORDER — CELECOXIB 400 MG PO CAPS
400.0000 mg | ORAL_CAPSULE | ORAL | Status: AC
  Administered 2016-04-20: 400 mg via ORAL

## 2016-04-20 MED ORDER — BUPIVACAINE-EPINEPHRINE (PF) 0.5% -1:200000 IJ SOLN
INTRAMUSCULAR | Status: AC
  Filled 2016-04-20: qty 30

## 2016-04-20 MED ORDER — MIDAZOLAM HCL 5 MG/5ML IJ SOLN
INTRAMUSCULAR | Status: DC | PRN
  Administered 2016-04-20: 2 mg via INTRAVENOUS

## 2016-04-20 MED ORDER — FENTANYL CITRATE (PF) 100 MCG/2ML IJ SOLN
INTRAMUSCULAR | Status: AC
  Filled 2016-04-20: qty 2

## 2016-04-20 MED ORDER — SUCCINYLCHOLINE CHLORIDE 20 MG/ML IJ SOLN
INTRAMUSCULAR | Status: DC | PRN
  Administered 2016-04-20: 100 mg via INTRAVENOUS

## 2016-04-20 MED ORDER — CHLORHEXIDINE GLUCONATE CLOTH 2 % EX PADS: 6.0000 | MEDICATED_PAD | Freq: Once | CUTANEOUS | Status: DC

## 2016-04-20 MED ORDER — ONDANSETRON HCL 4 MG/2ML IJ SOLN
INTRAMUSCULAR | Status: AC
  Filled 2016-04-20: qty 2

## 2016-04-20 MED ORDER — PROPOFOL 10 MG/ML IV BOLUS
INTRAVENOUS | Status: DC | PRN
  Administered 2016-04-20: 200 mg via INTRAVENOUS

## 2016-04-20 MED ORDER — OXYCODONE HCL 5 MG PO TABS
ORAL_TABLET | ORAL | Status: AC
  Filled 2016-04-20: qty 1

## 2016-04-20 MED ORDER — OXYCODONE HCL 5 MG/5ML PO SOLN: 5.0000 mg | Freq: Once | ORAL | Status: AC | PRN

## 2016-04-20 MED ORDER — ONDANSETRON HCL 4 MG/2ML IJ SOLN
INTRAMUSCULAR | Status: DC | PRN
Start: 2016-04-20 — End: 2016-04-20
  Administered 2016-04-20: 4 mg via INTRAVENOUS

## 2016-04-20 MED ORDER — CEFAZOLIN SODIUM-DEXTROSE 2-4 GM/100ML-% IV SOLN
2.0000 g | INTRAVENOUS | Status: AC
  Administered 2016-04-20: 2 g via INTRAVENOUS

## 2016-04-20 MED ORDER — FENTANYL CITRATE (PF) 100 MCG/2ML IJ SOLN: 50.0000 ug | INTRAMUSCULAR | Status: DC | PRN

## 2016-04-20 MED ORDER — PROPOFOL 10 MG/ML IV BOLUS
INTRAVENOUS | Status: AC
  Filled 2016-04-20: qty 20

## 2016-04-20 MED ORDER — ACETAMINOPHEN 500 MG PO TABS
ORAL_TABLET | ORAL | Status: AC
  Filled 2016-04-20: qty 2

## 2016-04-20 MED ORDER — OXYCODONE HCL 5 MG PO TABS
5.0000 mg | ORAL_TABLET | Freq: Once | ORAL | Status: AC | PRN
  Administered 2016-04-20: 5 mg via ORAL

## 2016-04-20 MED ORDER — GABAPENTIN 300 MG PO CAPS
300.0000 mg | ORAL_CAPSULE | ORAL | Status: AC
  Administered 2016-04-20: 300 mg via ORAL

## 2016-04-20 MED ORDER — SCOPOLAMINE 1 MG/3DAYS TD PT72: 1.0000 | MEDICATED_PATCH | Freq: Once | TRANSDERMAL | Status: DC | PRN

## 2016-04-20 MED ORDER — CEFAZOLIN SODIUM-DEXTROSE 2-4 GM/100ML-% IV SOLN
INTRAVENOUS | Status: AC
  Filled 2016-04-20: qty 100

## 2016-04-20 MED ORDER — CELECOXIB 200 MG PO CAPS
ORAL_CAPSULE | ORAL | Status: AC
  Filled 2016-04-20: qty 2

## 2016-04-20 MED ORDER — LIDOCAINE 2% (20 MG/ML) 5 ML SYRINGE
INTRAMUSCULAR | Status: DC | PRN
  Administered 2016-04-20: 100 mg via INTRAVENOUS

## 2016-04-20 MED ORDER — GLYCOPYRROLATE 0.2 MG/ML IJ SOLN: 0.2000 mg | Freq: Once | INTRAMUSCULAR | Status: DC | PRN

## 2016-04-20 MED ORDER — LACTATED RINGERS IV SOLN
INTRAVENOUS | Status: DC | PRN
  Administered 2016-04-20: 08:00:00 via INTRAVENOUS

## 2016-04-20 MED ORDER — PROMETHAZINE HCL 25 MG/ML IJ SOLN: 6.2500 mg | INTRAMUSCULAR | Status: DC | PRN

## 2016-04-20 MED ORDER — FENTANYL CITRATE (PF) 100 MCG/2ML IJ SOLN
INTRAMUSCULAR | Status: DC | PRN
  Administered 2016-04-20 (×2): 50 ug via INTRAVENOUS

## 2016-04-20 MED ORDER — MIDAZOLAM HCL 2 MG/2ML IJ SOLN
INTRAMUSCULAR | Status: AC
  Filled 2016-04-20: qty 2

## 2016-04-20 MED ORDER — HYDROCODONE-ACETAMINOPHEN 5-325 MG PO TABS: 1.0000 | ORAL_TABLET | ORAL | 0 refills | Status: DC | PRN

## 2016-04-20 MED ORDER — BUPIVACAINE-EPINEPHRINE (PF) 0.5% -1:200000 IJ SOLN
INTRAMUSCULAR | Status: DC | PRN
Start: 2016-04-20 — End: 2016-04-20
  Administered 2016-04-20: 10 mL

## 2016-04-20 MED ORDER — ACETAMINOPHEN 500 MG PO TABS
1000.0000 mg | ORAL_TABLET | ORAL | Status: AC
  Administered 2016-04-20: 1000 mg via ORAL

## 2016-04-20 MED ORDER — 0.9 % SODIUM CHLORIDE (POUR BTL) OPTIME
TOPICAL | Status: DC | PRN
  Administered 2016-04-20: 400 mL

## 2016-04-20 MED ORDER — MIDAZOLAM HCL 2 MG/2ML IJ SOLN: 1.0000 mg | INTRAMUSCULAR | Status: DC | PRN

## 2016-04-20 MED ORDER — FENTANYL CITRATE (PF) 100 MCG/2ML IJ SOLN
25.0000 ug | INTRAMUSCULAR | Status: DC | PRN
  Administered 2016-04-20: 50 ug via INTRAVENOUS

## 2016-04-20 MED ORDER — DEXAMETHASONE SODIUM PHOSPHATE 4 MG/ML IJ SOLN
INTRAMUSCULAR | Status: DC | PRN
  Administered 2016-04-20: 4 mg via INTRAVENOUS

## 2016-04-20 MED ORDER — GABAPENTIN 300 MG PO CAPS
ORAL_CAPSULE | ORAL | Status: AC
  Filled 2016-04-20: qty 1

## 2016-04-20 MED ORDER — LACTATED RINGERS IV SOLN
INTRAVENOUS | Status: DC
  Administered 2016-04-20 (×2): via INTRAVENOUS

## 2016-04-20 MED ORDER — DEXAMETHASONE SODIUM PHOSPHATE 10 MG/ML IJ SOLN
INTRAMUSCULAR | Status: AC
  Filled 2016-04-20: qty 1

## 2016-04-20 SURGICAL SUPPLY — 68 items
ADH SKN CLS APL DERMABOND .7 (GAUZE/BANDAGES/DRESSINGS) ×1
APL SKNCLS STERI-STRIP NONHPOA (GAUZE/BANDAGES/DRESSINGS)
APPLIER CLIP 9.375 MED OPEN (MISCELLANEOUS) ×3
APR CLP MED 9.3 20 MLT OPN (MISCELLANEOUS) ×1
BENZOIN TINCTURE PRP APPL 2/3 (GAUZE/BANDAGES/DRESSINGS) IMPLANT
BINDER BREAST LRG (GAUZE/BANDAGES/DRESSINGS) IMPLANT
BINDER BREAST MEDIUM (GAUZE/BANDAGES/DRESSINGS) IMPLANT
BINDER BREAST XLRG (GAUZE/BANDAGES/DRESSINGS) IMPLANT
BINDER BREAST XXLRG (GAUZE/BANDAGES/DRESSINGS) IMPLANT
BLADE HEX COATED 2.75 (ELECTRODE) ×3 IMPLANT
BLADE SURG 10 STRL SS (BLADE) IMPLANT
BLADE SURG 15 STRL LF DISP TIS (BLADE) ×1 IMPLANT
BLADE SURG 15 STRL SS (BLADE) ×3
CANISTER SUC SOCK COL 7IN (MISCELLANEOUS) IMPLANT
CANISTER SUCT 1200ML W/VALVE (MISCELLANEOUS) ×3 IMPLANT
CHLORAPREP W/TINT 26ML (MISCELLANEOUS) ×3 IMPLANT
CLIP APPLIE 9.375 MED OPEN (MISCELLANEOUS) IMPLANT
CLOSURE WOUND 1/2 X4 (GAUZE/BANDAGES/DRESSINGS)
COVER BACK TABLE 60X90IN (DRAPES) ×3 IMPLANT
COVER MAYO STAND STRL (DRAPES) ×3 IMPLANT
COVER PROBE W GEL 5X96 (DRAPES) ×3 IMPLANT
DECANTER SPIKE VIAL GLASS SM (MISCELLANEOUS) IMPLANT
DERMABOND ADVANCED (GAUZE/BANDAGES/DRESSINGS) ×2
DERMABOND ADVANCED .7 DNX12 (GAUZE/BANDAGES/DRESSINGS) ×1 IMPLANT
DEVICE DUBIN W/COMP PLATE 8390 (MISCELLANEOUS) ×3 IMPLANT
DRAPE LAPAROSCOPIC ABDOMINAL (DRAPES) ×3 IMPLANT
DRAPE UTILITY XL STRL (DRAPES) ×3 IMPLANT
DRSG PAD ABDOMINAL 8X10 ST (GAUZE/BANDAGES/DRESSINGS) IMPLANT
ELECT REM PT RETURN 9FT ADLT (ELECTROSURGICAL) ×3
ELECTRODE REM PT RTRN 9FT ADLT (ELECTROSURGICAL) ×1 IMPLANT
GLOVE BIOGEL PI IND STRL 7.0 (GLOVE) IMPLANT
GLOVE BIOGEL PI INDICATOR 7.0 (GLOVE) ×4
GLOVE ECLIPSE 6.5 STRL STRAW (GLOVE) IMPLANT
GLOVE EUDERMIC 7 POWDERFREE (GLOVE) ×1 IMPLANT
GLOVE SURG SS PI 6.5 STRL IVOR (GLOVE) ×2 IMPLANT
GLOVE SURG SS PI 7.0 STRL IVOR (GLOVE) ×2 IMPLANT
GOWN STRL REUS W/ TWL LRG LVL3 (GOWN DISPOSABLE) ×1 IMPLANT
GOWN STRL REUS W/ TWL XL LVL3 (GOWN DISPOSABLE) ×1 IMPLANT
GOWN STRL REUS W/TWL LRG LVL3 (GOWN DISPOSABLE) ×3
GOWN STRL REUS W/TWL XL LVL3 (GOWN DISPOSABLE) ×3
ILLUMINATOR WAVEGUIDE N/F (MISCELLANEOUS) IMPLANT
KIT MARKER MARGIN INK (KITS) ×3 IMPLANT
LIGHT WAVEGUIDE WIDE FLAT (MISCELLANEOUS) IMPLANT
NDL HYPO 25X1 1.5 SAFETY (NEEDLE) ×1 IMPLANT
NEEDLE HYPO 25X1 1.5 SAFETY (NEEDLE) ×3 IMPLANT
NS IRRIG 1000ML POUR BTL (IV SOLUTION) ×3 IMPLANT
PACK BASIN DAY SURGERY FS (CUSTOM PROCEDURE TRAY) ×3 IMPLANT
PENCIL BUTTON HOLSTER BLD 10FT (ELECTRODE) ×3 IMPLANT
SHEET MEDIUM DRAPE 40X70 STRL (DRAPES) IMPLANT
SLEEVE SCD COMPRESS KNEE MED (MISCELLANEOUS) ×3 IMPLANT
SPONGE GAUZE 4X4 12PLY STER LF (GAUZE/BANDAGES/DRESSINGS) IMPLANT
SPONGE LAP 18X18 X RAY DECT (DISPOSABLE) IMPLANT
SPONGE LAP 4X18 X RAY DECT (DISPOSABLE) ×3 IMPLANT
STRIP CLOSURE SKIN 1/2X4 (GAUZE/BANDAGES/DRESSINGS) IMPLANT
SUT ETHILON 3 0 FSL (SUTURE) IMPLANT
SUT MNCRL AB 4-0 PS2 18 (SUTURE) ×3 IMPLANT
SUT SILK 2 0 SH (SUTURE) ×3 IMPLANT
SUT VIC AB 2-0 CT1 27 (SUTURE)
SUT VIC AB 2-0 CT1 TAPERPNT 27 (SUTURE) IMPLANT
SUT VIC AB 3-0 SH 27 (SUTURE)
SUT VIC AB 3-0 SH 27X BRD (SUTURE) IMPLANT
SUT VICRYL 3-0 CR8 SH (SUTURE) ×3 IMPLANT
SYRINGE 10CC LL (SYRINGE) ×3 IMPLANT
TOWEL OR 17X24 6PK STRL BLUE (TOWEL DISPOSABLE) ×3 IMPLANT
TOWEL OR NON WOVEN STRL DISP B (DISPOSABLE) IMPLANT
TUBE CONNECTING 20'X1/4 (TUBING) ×1
TUBE CONNECTING 20X1/4 (TUBING) ×2 IMPLANT
YANKAUER SUCT BULB TIP NO VENT (SUCTIONS) ×3 IMPLANT

## 2016-04-20 NOTE — Anesthesia Preprocedure Evaluation (Addendum)
Anesthesia Evaluation    Airway Mallampati: II  TM Distance: >3 FB Neck ROM: Full    Dental no notable dental hx.    Pulmonary    Pulmonary exam normal        Cardiovascular hypertension, Pt. on medications Normal cardiovascular exam     Neuro/Psych PSYCHIATRIC DISORDERS Anxiety Restless leg syndrome    GI/Hepatic GERD  Medicated and Poorly Controlled,  Endo/Other    Renal/GU      Musculoskeletal   Abdominal   Peds  Hematology   Anesthesia Other Findings   Reproductive/Obstetrics                            Anesthesia Physical Anesthesia Plan  ASA: II  Anesthesia Plan: General   Post-op Pain Management:    Induction: Intravenous  Airway Management Planned: Oral ETT  Additional Equipment:   Intra-op Plan:   Post-operative Plan: Extubation in OR  Informed Consent: I have reviewed the patients History and Physical, chart, labs and discussed the procedure including the risks, benefits and alternatives for the proposed anesthesia with the patient or authorized representative who has indicated his/her understanding and acceptance.   Dental advisory given  Plan Discussed with: CRNA  Anesthesia Plan Comments:        Anesthesia Quick Evaluation

## 2016-04-20 NOTE — Op Note (Signed)
Patient Name:           Tara [name]   Date of Surgery:        04/20/2016  Pre op Diagnosis:      Abnormal mammogram left breast  Post op Diagnosis:    Same  Procedure:                 Left breast partial mastectomy with radioactive seed localization and margin assessment  Surgeon:                     Edsel Petrin. Dalbert Batman, M.D., FACS  Assistant:                      Or staff   Indication for Assistant: N/A  Operative Indications:    This is a 70 year old female who presents for evaluation of complex sclerosing lesion of the left breast, upper outer quadrant.      The patient has a remote history of a left breast biopsy for benign cyst. Otherwise no breast problems in the past. On June 11, 2015 she had been evaluated at [facility] on a 5 mm cyst in the upper inner quadrant left breast and failed six-month follow-up.     Recent screening mammograms at [facility] showed a tiny palpable nodule in the upper inner quadrant which was stable but found a small 2 mm area of distortion in the superior left breast, 12:30 position, posterior depth. The old breast biopsy scar was medial. 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 They recommended excision.      I discussed the state of the literature with complex sclerosing lesion. I told her that incidence of upgrade to some type of malignancy was probably somewhere between 5 and 9%. That is the reason for excision. She understands that most likely she does not have cancer. She wants just area excised.       Family history reveals mother died of metastatic breast cancer at age 26, 2 years following diagnosis. Grandmother had colon cancer. No other breast cancer or ovarian cancer in the family.      She will be scheduled for left breast lumpectomy with radioactive seed localization.   Operative Findings:       The radioactive seed was placed by the radiologist as an outpatient recently.  The seed was  very close to the biopsy marker clip and the area of distortion seen on mammography.  I was able to hear the radioactive signal using the neoprobe in the holding area and in the operating room.  The specimen mammogram looked very good with the marker clip and the seed in the center of the specimen.  Procedure in Detail:          Following the induction of general LMA anesthesia the left breast was prepped and draped in a sterile fashion.  Intravenous antibiotics were given.  Surgical timeout was performed.  0.5% Marcaine with epinephrine was used as a local infiltration anesthetic.  Using the neoprobe I selected the position for the incision which was a curvilinear transverse incision at the 1:00 position of the left breast, fairly high on the breast.  Dissection was carried down into the breast tissue.  Using the neoprobe as a guide I dissected around the radioactive signal.  The specimen was removed and marked with silk sutures and a 6 color ink kit to orient the pathologist.  Specimen mammogram looked good as  described above.  Specimen was marked and sent to the lab.  The lumpectomy cavity was marked with 5 metal clips in the cardinal positions.  The wound was irrigated.  There was no bleeding.  The tissues were closed with interrupted sutures of 3-0 Vicryl and the skin closed with running subcuticular 4-0 Monocryl and Dermabond.  Breast binder was placed.  The patient tolerated the procedure well was taken to PACU in stable condition.  EBL 10 mL.  Counts correct.  Complications none.     Edsel Petrin. Dalbert Batman, M.D., FACS General and Minimally Invasive Surgery Breast and Colorectal Surgery  04/20/2016 9:47 AM

## 2016-04-20 NOTE — Discharge Instructions (Signed)
[facility] °Office Phone Number [phone] ° °BREAST BIOPSY/ PARTIAL MASTECTOMY: POST OP INSTRUCTIONS ° °Always review your discharge instruction sheet given to you by the facility where your surgery was performed. ° °IF YOU HAVE DISABILITY OR FAMILY LEAVE FORMS, YOU MUST BRING THEM TO THE OFFICE FOR PROCESSING.  DO NOT GIVE THEM TO YOUR DOCTOR. ° °1. A prescription for pain medication may be given to you upon discharge.  Take your pain medication as prescribed, if needed.  If narcotic pain medicine is not needed, then you may take acetaminophen (Tylenol) or ibuprofen (Advil) as needed. °2. Take your usually prescribed medications unless otherwise directed °3. If you need a refill on your pain medication, please contact your pharmacy.  They will contact our office to request authorization.  Prescriptions will not be filled after 5pm or on week-ends. °4. You should eat very light the first 24 hours after surgery, such as soup, crackers, pudding, etc.  Resume your normal diet the day after surgery. °5. Most patients will experience some swelling and bruising in the breast.  Ice packs and a good support bra will help.  Swelling and bruising can take several days to resolve.  °6. It is common to experience some constipation if taking pain medication after surgery.  Increasing fluid intake and taking a stool softener will usually help or prevent this problem from occurring.  A mild laxative (Milk of Magnesia or Miralax) should be taken according to package directions if there are no bowel movements after 48 hours. °7. Unless discharge instructions indicate otherwise, you may remove your bandages 24-48 hours after surgery, and you may shower at that time.  You may have steri-strips (small skin tapes) in place directly over the incision.  These strips should be left on the skin for 7-10 days.  If your surgeon used skin glue on the incision, you may shower in 24 hours.  The glue will flake off over the  next 2-3 weeks.  Any sutures or staples will be removed at the office during your follow-up visit. °8. ACTIVITIES:  You may resume regular daily activities (gradually increasing) beginning the next day.  Wearing a good support bra or sports bra minimizes pain and swelling.  You may have sexual intercourse when it is comfortable. °a. You may drive when you no longer are taking prescription pain medication, you can comfortably wear a seatbelt, and you can safely maneuver your car and apply brakes. °b. RETURN TO WORK:  ______________________________________________________________________________________ °9. You should see your doctor in the office for a follow-up appointment approximately two weeks after your surgery.  Your doctor’s nurse will typically make your follow-up appointment when she calls you with your pathology report.  Expect your pathology report 2-3 business days after your surgery.  You may call to check if you do not hear from us after three days. °10. OTHER INSTRUCTIONS: _______________________________________________________________________________________________ _____________________________________________________________________________________________________________________________________ °_____________________________________________________________________________________________________________________________________ °_____________________________________________________________________________________________________________________________________ ° °WHEN TO CALL YOUR DOCTOR: °1. Fever over 101.0 °2. Nausea and/or vomiting. °3. Extreme swelling or bruising. °4. Continued bleeding from incision. °5. Increased pain, redness, or drainage from the incision. ° °The clinic staff is available to answer your questions during regular business hours.  Please don’t hesitate to call and ask to speak to one of the nurses for clinical concerns.  If you have a medical emergency, go to the nearest  emergency room or call 911.  A surgeon from [facility] is always on call at the hospital. ° °For further questions, please visit [email or web address]  ° ° ° °  Post Anesthesia Home Care Instructions ° °Activity: °Get plenty of rest for the remainder of the day. A responsible adult should stay with you for 24 hours following the procedure.  °For the next 24 hours, DO NOT: °-Drive a car °-Operate machinery °-Drink alcoholic beverages °-Take any medication unless instructed by your physician °-Make any legal decisions or sign important papers. ° °Meals: °Start with liquid foods such as gelatin or soup. Progress to regular foods as tolerated. Avoid greasy, spicy, heavy foods. If nausea and/or vomiting occur, drink only clear liquids until the nausea and/or vomiting subsides. Call your physician if vomiting continues. ° °Special Instructions/Symptoms: °Your throat may feel dry or sore from the anesthesia or the breathing tube placed in your throat during surgery. If this causes discomfort, gargle with warm salt water. The discomfort should disappear within 24 hours. ° °If you had a scopolamine patch placed behind your ear for the management of post- operative nausea and/or vomiting: ° °1. The medication in the patch is effective for 72 hours, after which it should be removed.  Wrap patch in a tissue and discard in the trash. Wash hands thoroughly with soap and water. °2. You may remove the patch earlier than 72 hours if you experience unpleasant side effects which may include dry mouth, dizziness or visual disturbances. °3. Avoid touching the patch. Wash your hands with soap and water after contact with the patch. °  ° °

## 2016-04-20 NOTE — Transfer of Care (Signed)
Immediate Anesthesia Transfer of Care Note  Patient: Tara [name]  Procedure(s) Performed: Procedure(s): LEFT BREAST LUMPECTOMY WITH RADIOACTIVE SEED LOCALIZATION (Left)  Patient Location: PACU  Anesthesia Type:General  Level of Consciousness: awake, alert  and oriented  Airway & Oxygen Therapy: Patient Spontanous Breathing and Patient connected to face mask  Post-op Assessment: Report given to RN and Post -op Vital signs reviewed and stable  Post vital signs: Reviewed and stable  Last Vitals:  Vitals:   04/20/16 0953 04/20/16 0954  BP: 127/67   Pulse: 95 90  Resp:  14  Temp:      Last Pain:  Vitals:   04/20/16 0800  TempSrc: Oral         Complications: No apparent anesthesia complications

## 2016-04-20 NOTE — Anesthesia Postprocedure Evaluation (Signed)
Anesthesia Post Note  Patient: YOCHEVED OLIVOS  Procedure(s) Performed: Procedure(s) (LRB): LEFT BREAST LUMPECTOMY WITH RADIOACTIVE SEED LOCALIZATION (Left)  Patient location during evaluation: PACU Anesthesia Type: General Level of consciousness: awake and alert Pain management: pain level controlled Vital Signs Assessment: post-procedure vital signs reviewed and stable Respiratory status: spontaneous breathing, nonlabored ventilation, respiratory function stable and patient connected to nasal cannula oxygen Cardiovascular status: blood pressure returned to baseline and stable Postop Assessment: no signs of nausea or vomiting Anesthetic complications: no    Last Vitals:  Vitals:   04/20/16 1040 04/20/16 1045  BP:  120/64  Pulse: 74 65  Resp: 15 12  Temp:      Last Pain:  Vitals:   04/20/16 1040  TempSrc:   PainSc: [place]

## 2016-04-20 NOTE — Interval H&P Note (Signed)
History and Physical Interval Note:  04/20/2016 8:27 AM  Tara [name]  has presented today for surgery, with the diagnosis of ABNORMAL MAMMOGRAM OF LEFT BREAST  The various methods of treatment have been discussed with the patient and family. After consideration of risks, benefits and other options for treatment, the patient has consented to  Procedure(s): LEFT BREAST LUMPECTOMY WITH RADIOACTIVE SEED LOCALIZATION (Left) as a surgical intervention .  The patient's history has been reviewed, patient examined, no change in status, stable for surgery.  I have reviewed the patient's chart and labs.  Questions were answered to the patient's satisfaction.     Adin Hector

## 2016-04-20 NOTE — Anesthesia Procedure Notes (Signed)
Procedure Name: Intubation Date/Time: 04/20/2016 9:33 AM Performed by: Bufford Spikes Pre-anesthesia Checklist: Patient identified, Timeout performed, Emergency Drugs available, Suction available and Patient being monitored Patient Re-evaluated:Patient Re-evaluated prior to inductionOxygen Delivery Method: Circle system utilized Preoxygenation: Pre-oxygenation with 100% oxygen Intubation Type: IV induction Ventilation: Mask ventilation with difficulty and Oral airway inserted - appropriate to patient size Laryngoscope Size: Glidescope and 4 Grade View: Grade III Tube type: Oral Tube size: 7.0 mm Number of attempts: 2 Airway Equipment and Method: Stylet and Video-laryngoscopy Placement Confirmation: ETT inserted through vocal cords under direct vision,  positive ETCO2 and breath sounds checked- equal and bilateral Secured at: 22 cm Tube secured with: Tape Dental Injury: Teeth and Oropharynx as per pre-operative assessment  Difficulty Due To: Difficult Airway- due to limited oral opening, Difficult Airway- due to dentition and Difficult Airway- due to anterior larynx

## 2016-04-21 ENCOUNTER — Encounter (HOSPITAL_BASED_OUTPATIENT_CLINIC_OR_DEPARTMENT_OTHER): Payer: Self-pay | Admitting: General Surgery

## 2016-04-21 NOTE — Addendum Note (Signed)
Addendum  created 04/21/16 1234 by Ernesta Amble Destenie Ingber, CRNA   Charge Capture section accepted

## 2016-04-22 NOTE — Progress Notes (Signed)
Inform patient of Pathology report,.his shows complex sclerosing lesion but no malignancy.  Obviously excellent news.  I will discuss this with her in detail at the next office visit.

## 2016-05-20 DIAGNOSIS — Z23 Encounter for immunization: Secondary | ICD-10-CM | POA: Diagnosis not present

## 2016-08-03 DIAGNOSIS — M545 Low back pain: Secondary | ICD-10-CM | POA: Diagnosis not present

## 2016-08-13 ENCOUNTER — Ambulatory Visit
Admission: RE | Admit: 2016-08-13 | Discharge: 2016-08-13 | Disposition: A | Discharge status: Home / Self Care | Payer: Medicare Other | Source: Ambulatory Visit | Attending: Family Medicine | Admitting: Family Medicine

## 2016-08-13 ENCOUNTER — Other Ambulatory Visit: Payer: Self-pay | Admitting: Family Medicine

## 2016-08-13 DIAGNOSIS — R52 Pain, unspecified: Secondary | ICD-10-CM

## 2016-08-13 DIAGNOSIS — M542 Cervicalgia: Secondary | ICD-10-CM | POA: Diagnosis not present

## 2016-08-13 DIAGNOSIS — M47816 Spondylosis without myelopathy or radiculopathy, lumbar region: Secondary | ICD-10-CM | POA: Diagnosis not present

## 2016-09-03 DIAGNOSIS — G8929 Other chronic pain: Secondary | ICD-10-CM | POA: Diagnosis not present

## 2016-09-03 DIAGNOSIS — M545 Low back pain: Secondary | ICD-10-CM | POA: Diagnosis not present

## 2016-09-22 DIAGNOSIS — G8929 Other chronic pain: Secondary | ICD-10-CM | POA: Diagnosis not present

## 2016-09-22 DIAGNOSIS — M545 Low back pain: Secondary | ICD-10-CM | POA: Diagnosis not present

## 2016-09-23 ENCOUNTER — Other Ambulatory Visit: Payer: Self-pay

## 2016-09-23 NOTE — Telephone Encounter (Signed)
ERROR

## 2016-09-30 DIAGNOSIS — G8929 Other chronic pain: Secondary | ICD-10-CM | POA: Diagnosis not present

## 2016-09-30 DIAGNOSIS — M545 Low back pain: Secondary | ICD-10-CM | POA: Diagnosis not present

## 2016-10-07 DIAGNOSIS — M545 Low back pain: Secondary | ICD-10-CM | POA: Diagnosis not present

## 2016-10-12 DIAGNOSIS — M545 Low back pain: Secondary | ICD-10-CM | POA: Diagnosis not present

## 2016-10-14 DIAGNOSIS — M545 Low back pain: Secondary | ICD-10-CM | POA: Diagnosis not present

## 2016-10-19 DIAGNOSIS — M545 Low back pain: Secondary | ICD-10-CM | POA: Diagnosis not present

## 2016-12-30 DIAGNOSIS — S30861A Insect bite (nonvenomous) of abdominal wall, initial encounter: Secondary | ICD-10-CM | POA: Diagnosis not present

## 2017-01-12 DIAGNOSIS — S80869A Insect bite (nonvenomous), unspecified lower leg, initial encounter: Secondary | ICD-10-CM | POA: Diagnosis not present

## 2017-01-20 DIAGNOSIS — H10502 Unspecified blepharoconjunctivitis, left eye: Secondary | ICD-10-CM | POA: Diagnosis not present

## 2017-03-10 DIAGNOSIS — H10503 Unspecified blepharoconjunctivitis, bilateral: Secondary | ICD-10-CM | POA: Diagnosis not present

## 2017-03-22 DIAGNOSIS — J019 Acute sinusitis, unspecified: Secondary | ICD-10-CM | POA: Diagnosis not present

## 2017-03-22 DIAGNOSIS — I10 Essential (primary) hypertension: Secondary | ICD-10-CM | POA: Diagnosis not present

## 2017-03-22 DIAGNOSIS — E785 Hyperlipidemia, unspecified: Secondary | ICD-10-CM | POA: Diagnosis not present

## 2017-03-22 DIAGNOSIS — R5383 Other fatigue: Secondary | ICD-10-CM | POA: Diagnosis not present

## 2017-03-22 LAB — LIPID PANEL
Cholesterol: 207 — AB (ref 0–200)
HDL: 59 (ref 35–70)
LDL CALC: 113
TRIGLYCERIDES: 174 — AB (ref 40–160)

## 2017-03-22 LAB — TSH: TSH: 1.7 (ref 0.41–5.90)

## 2017-03-22 LAB — CBC AND DIFFERENTIAL
HEMATOCRIT: 42 (ref 36–46)
HEMOGLOBIN: 13.5 (ref 12.0–16.0)
NEUTROS ABS: 3
PLATELETS: 193 (ref 150–399)
WBC: 5.3

## 2017-03-22 LAB — HEPATIC FUNCTION PANEL
ALT: 25 (ref 7–35)
AST: 20 (ref 13–35)
Alkaline Phosphatase: 74 (ref 25–125)
BILIRUBIN, TOTAL: 0.6

## 2017-03-22 LAB — BASIC METABOLIC PANEL
BUN: 18 (ref 4–21)
CREATININE: 1 (ref 0.5–1.1)
GLUCOSE: 95
Potassium: 4.7 (ref 3.4–5.3)
Sodium: 140 (ref 137–147)

## 2017-04-14 DIAGNOSIS — Z23 Encounter for immunization: Secondary | ICD-10-CM | POA: Diagnosis not present

## 2017-06-17 DIAGNOSIS — G8929 Other chronic pain: Secondary | ICD-10-CM | POA: Diagnosis not present

## 2017-06-17 DIAGNOSIS — M545 Low back pain: Secondary | ICD-10-CM | POA: Diagnosis not present

## 2017-06-21 DIAGNOSIS — R928 Other abnormal and inconclusive findings on diagnostic imaging of breast: Secondary | ICD-10-CM | POA: Diagnosis not present

## 2017-06-21 DIAGNOSIS — I10 Essential (primary) hypertension: Secondary | ICD-10-CM | POA: Diagnosis not present

## 2017-06-21 DIAGNOSIS — Z803 Family history of malignant neoplasm of breast: Secondary | ICD-10-CM | POA: Diagnosis not present

## 2017-06-21 DIAGNOSIS — E785 Hyperlipidemia, unspecified: Secondary | ICD-10-CM | POA: Diagnosis not present

## 2017-06-24 ENCOUNTER — Other Ambulatory Visit: Payer: Self-pay | Admitting: General Surgery

## 2017-06-24 DIAGNOSIS — N63 Unspecified lump in unspecified breast: Secondary | ICD-10-CM

## 2017-07-07 DIAGNOSIS — M5136 Other intervertebral disc degeneration, lumbar region: Secondary | ICD-10-CM | POA: Diagnosis not present

## 2017-07-27 ENCOUNTER — Ambulatory Visit
Admission: RE | Admit: 2017-07-27 | Discharge: 2017-07-27 | Disposition: A | Discharge status: Home / Self Care | Payer: Medicare Other | Source: Ambulatory Visit | Attending: General Surgery | Admitting: General Surgery

## 2017-07-27 ENCOUNTER — Other Ambulatory Visit: Payer: Self-pay | Admitting: General Surgery

## 2017-07-27 DIAGNOSIS — N6489 Other specified disorders of breast: Secondary | ICD-10-CM | POA: Diagnosis not present

## 2017-07-27 DIAGNOSIS — R928 Other abnormal and inconclusive findings on diagnostic imaging of breast: Secondary | ICD-10-CM

## 2017-07-27 DIAGNOSIS — N63 Unspecified lump in unspecified breast: Secondary | ICD-10-CM

## 2017-07-29 DIAGNOSIS — M5136 Other intervertebral disc degeneration, lumbar region: Secondary | ICD-10-CM | POA: Diagnosis not present

## 2017-08-11 DIAGNOSIS — M79605 Pain in left leg: Secondary | ICD-10-CM | POA: Diagnosis not present

## 2017-08-11 DIAGNOSIS — M545 Low back pain: Secondary | ICD-10-CM | POA: Diagnosis not present

## 2017-08-11 DIAGNOSIS — M79604 Pain in right leg: Secondary | ICD-10-CM | POA: Diagnosis not present

## 2017-08-15 DIAGNOSIS — M79604 Pain in right leg: Secondary | ICD-10-CM | POA: Diagnosis not present

## 2017-08-15 DIAGNOSIS — M545 Low back pain: Secondary | ICD-10-CM | POA: Diagnosis not present

## 2017-08-15 DIAGNOSIS — M79605 Pain in left leg: Secondary | ICD-10-CM | POA: Diagnosis not present

## 2017-08-22 DIAGNOSIS — M545 Low back pain: Secondary | ICD-10-CM | POA: Diagnosis not present

## 2017-08-22 DIAGNOSIS — M79604 Pain in right leg: Secondary | ICD-10-CM | POA: Diagnosis not present

## 2017-08-22 DIAGNOSIS — M79605 Pain in left leg: Secondary | ICD-10-CM | POA: Diagnosis not present

## 2017-08-22 DIAGNOSIS — H10503 Unspecified blepharoconjunctivitis, bilateral: Secondary | ICD-10-CM | POA: Diagnosis not present

## 2017-08-26 ENCOUNTER — Ambulatory Visit: Payer: Medicare Other | Admitting: Family Medicine

## 2017-08-29 DIAGNOSIS — M79604 Pain in right leg: Secondary | ICD-10-CM | POA: Diagnosis not present

## 2017-08-29 DIAGNOSIS — M545 Low back pain: Secondary | ICD-10-CM | POA: Diagnosis not present

## 2017-08-29 DIAGNOSIS — M79605 Pain in left leg: Secondary | ICD-10-CM | POA: Diagnosis not present

## 2017-08-30 ENCOUNTER — Ambulatory Visit (INDEPENDENT_AMBULATORY_CARE_PROVIDER_SITE_OTHER): Payer: Medicare Other | Admitting: Family Medicine

## 2017-08-30 ENCOUNTER — Encounter: Payer: Self-pay | Admitting: Family Medicine

## 2017-08-30 VITALS — BP 136/82 | HR 60 | Ht 62.75 in | Wt 190.4 lb

## 2017-08-30 DIAGNOSIS — Z833 Family history of diabetes mellitus: Secondary | ICD-10-CM | POA: Diagnosis not present

## 2017-08-30 DIAGNOSIS — R5383 Other fatigue: Secondary | ICD-10-CM

## 2017-08-30 DIAGNOSIS — F321 Major depressive disorder, single episode, moderate: Secondary | ICD-10-CM | POA: Insufficient documentation

## 2017-08-30 DIAGNOSIS — I10 Essential (primary) hypertension: Secondary | ICD-10-CM | POA: Diagnosis not present

## 2017-08-30 DIAGNOSIS — Z1321 Encounter for screening for nutritional disorder: Secondary | ICD-10-CM | POA: Diagnosis not present

## 2017-08-30 DIAGNOSIS — M8580 Other specified disorders of bone density and structure, unspecified site: Secondary | ICD-10-CM | POA: Diagnosis not present

## 2017-08-30 DIAGNOSIS — G2581 Restless legs syndrome: Secondary | ICD-10-CM | POA: Insufficient documentation

## 2017-08-30 DIAGNOSIS — E782 Mixed hyperlipidemia: Secondary | ICD-10-CM

## 2017-08-30 DIAGNOSIS — K589 Irritable bowel syndrome without diarrhea: Secondary | ICD-10-CM

## 2017-08-30 DIAGNOSIS — F411 Generalized anxiety disorder: Secondary | ICD-10-CM | POA: Insufficient documentation

## 2017-08-30 DIAGNOSIS — Z87891 Personal history of nicotine dependence: Secondary | ICD-10-CM | POA: Insufficient documentation

## 2017-08-30 DIAGNOSIS — Z131 Encounter for screening for diabetes mellitus: Secondary | ICD-10-CM | POA: Diagnosis not present

## 2017-08-30 DIAGNOSIS — E669 Obesity, unspecified: Secondary | ICD-10-CM

## 2017-08-30 NOTE — Patient Instructions (Addendum)
Please in the near future try to contact your pastor about possible counseling for yourself.  -Please try to find out what medicines you have used in the past to try to help with sleep that did not work well for you.  It is important I know this so we can wean you off the clonazepam and try something else.  I would not want to give you the same medicines if we know you did not tolerate it well.  I highly recommend you obtain a counselor to help with the stress of your husband's cancer etc. at this time.  Below is a list of counselors that you can call for appointments  Since we will not change her meds at this time and you would rather work on exercise, talking to your pastor and other friends for support etc., please let me know if this becomes more intolerable and the symptoms worsen in the near future     Behavioral Health/ Counseling Referrals  Dr. Tomi Bamberger, PHD Dr. Tomi Bamberger, PHD is a counselor in [place], [place].  [address] [place], [place] [address] [place] [phone]   Kristie Cowman, [place]  [phone] [email or web address] ( she does [place] and faith-based coaching and counseling )   [employer] ( faith-based counseling ) Address: [place]. [place], [place] [address] [phone] [address] for appointments [phone] Fax Hours: Monday - Thursday 8:00am-6:00pm Closed for lunch 12-1Thursday only Friday: Closed all day   [facility] Nunzio Cobbs, LCSW, ACSW, M.ED.  -Nunzio Cobbs is a licensed clinical social worker in practice over 35 years and with Dr. Chucky May for the last 10 years.  -She sees adults, adolescents, children & families and couples. -Services are provided for mood and anxiety disorders, marital issues, family or parent/child problems, parenting, co-dependency, gender issues, trauma, grief, and stages of life issues. She also  provides critical incident stress debriefing.  -Pamala Hurry accepts many employee assistance programs (EAP), [employer], Pharmacist, hospital.  PHONE  [phone]                FAX [phone]   Rodena [email or web address] [employer]- gen counseling;  PHD   Wilber Oliphant, MSW [address] [place]   [facility] Apolonio Schneiders, PhD [address], [place] [phone]   [place] [facility]- children [address] [place], [place] [phone]   Lanelle Bal Professional Counselor Counseling and [employer] [place] abuse [facility] Manager [address], [place] [phone] [place] [address], [place] Delmer Islam, PhD Oneida Arenas, PhD Leitha Bleak, LCSW Jillene Bucks, PhD-child, adolescent and adults   [facility] [address], [place] [phone] Merilyn Baba, MS-child, adolescent and adults Lennart Pall, PhD-adolescent and adults   [facility]-grief, terminal illness [place], [place] [address], [employer] [address] [place], [place]   [facility] [address], [place], [place] [phone]   [facility] [address] [place] [phone]   [facility] [address], [place]   [facility] [address] [address], [place]

## 2017-08-30 NOTE — Progress Notes (Signed)
New patient office visit note:  Impression and Recommendations:    1. GAD (generalized anxiety disorder)   2. Hypertension, unspecified type   3. Mixed hyperlipidemia   4. History of smoking 10-25 pack years- 20pk yr hx- quit age 72   5. Depression, major, single episode, moderate ([place])   6. Restless leg syndrome   7. Osteopenia, unspecified location   8. Irritable bowel syndrome, unspecified type   9. Other fatigue   10. Family history of diabetes mellitus (DM)   11. Obesity (BMI 30.0-34.9)      Education and routine counseling performed. Handouts provided.  1. Breast Health - Advised the patient to speak with Dr. Dalbert Batman or Dr. Darrel Hoover nurse if she has concerns about her breast health.  She may consider asking the doctor if she would be a good candidate for mastectomy, since she's had so many biopsies and mastectomies.  2. Anxiety & Depression - Discussed that we need to get her the help she needs to cope with her husband's recent diagnosis of colon cancer.  - Advised the patient about the importance of getting her on the right mood medicines to help manage her depression.  - At this time, we will not change her medications, but will consider increasing her dose of Wellbutrin.  Patient will let us know in the meantime if anything worsens.  - Patient may also visit her pastor for counseling and consolation.  Patient can ask the GI doc or cancer doc if there is a support group she can join. Patient is concerned that if she seeks help, it will cause "problems at home" - per patient, her husband doesn't even understand why she takes antidepressants.  - Counseling highly recommended to the patient at this time, to help her through her husband's cancer diagnosis.  A list of counselors provided to the patient for her use.  Sleep Aid - Patient currently takes klonopin to help her sleep.  She tried sleep aid medications in the past that she could not tolerate.  Advised the patient  to look up and report to [place] these medications.  As we wean her off of her klonopin, we do not want to prescribe her medicines that she tolerated poorly in the past.  General Health Maintenance - Hand out provided on exercise - especially as it is one of the best ways to treat stress.  Advised the patient that exercise is one of the best things for her mood.  Advised the patient to work toward exercising to improve health.    - Patient may begin striving toward goals with 30 minutes of activity 5 days a week.  Recommended that the patient eventually strive for at least 150 minutes of cardio per week according to guidelines established by the [facility].   - Healthy dietary habits encouraged.  Patient should limit carbohydrate intake, especially simple sugars.  Diet should contain high amounts of lean protein, vegetables, and fruits.  Mediterranean Diet recommended.  - Patient should make sure to consume adequate amounts of water - on an average day, at least half of body weight in oz of water per day.  Follow-Up - Fasting lab work drawn today.  Pt was in the office today for 60+ minutes, with over 50% time spent in face to face counseling of patients various medical conditions, treatment plans of those medical conditions including medicine management and lifestyle modification, strategies to improve health and well being; and in coordination of care. SEE ABOVE FOR DETAILS  Orders Placed This Encounter  Procedures  . CBC with Differential/Platelet  . Comprehensive metabolic panel  . Lipid panel  . Hemoglobin A1c  . TSH  . T4, free  . VITAMIN D 25 Hydroxy (Vit-D Deficiency, Fractures)   Gross side effects, risk and benefits, and alternatives of medications discussed with patient.  Patient is aware that all medications have potential side effects and we are unable to predict every side effect or drug-drug interaction that may occur.  Expresses verbal understanding and consents to current therapy plan  and treatment regimen.  Return for Please follow-up my next available to discuss additional issues and concerns. We can review bldwrk t.  Please see AVS handed out to patient at the end of our visit for further patient instructions/ counseling done pertaining to today's office visit.    Note: This document was prepared using Dragon voice recognition software and may include unintentional dictation errors.  This document serves as a record of services personally performed by Mellody Dance, DO. It was created on her behalf by Toni Amend, a trained medical scribe. The creation of this record is based on the scribe's personal observations and the provider's statements to them.   I have reviewed the above medical documentation for accuracy and completeness and I concur.  Mellody Dance 08/30/17 11:31 AM ----------------------------------------------------------------------------------------------------------------------    Subjective:    Chief complaint:   Chief Complaint  Patient presents with  . Establish Care     HPI: Tara [name] is a pleasant 72 y.o. female who presents to [facility] today to review their medical history with me and establish care.   I asked the patient to review their chronic problem list with me to ensure everything was updated and accurate.    All recent office visits with other providers, any medical records that patient brought in etc  - I reviewed today.     We asked pt to get [place] their medical records from [facility] providers/ specialists that they had seen within the past 3-5 years- if they are in private practice and/or do not work for [employer], [facility], [place], [place] or [employer] owned practice.  Told them to call their specialists to clarify this if they are not sure.   Previous PCP was at Dr. Arelia Sneddon practice, but she retired.  She has been following up with the PA over there, and has had blood work drawn, but hasn't  had a physical in a while.  She notes that October of 2018 was likely her last visit for flu shot and labs.  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 Married to Curlene Dolphin for 46 years. No kids.  Two step children, 3 grandchildren, and 2 cats.  Tobacco Use Former smoker.  Quit at age 27. Only 20 years, at about a pack per day.  20 pack-year history.  EtOH Use Consumes 10 drinks per week.  No more than 1-2 drinks per day. Drinks mixed drinks with Diet 7-up. Each night, she sits down at 5 o'clock, has a drink or two, and eats dinner  Her husband has colon cancer.   He had a colonoscopy the first of December. Her husband refuses to get help for his cancer, and won't talk to her about it. The last thing she wants to do right now is upset her husband.  Family History Father had dementia. Her father historically took klonopin. Mother's  mother had diabetes.  Surgical History History of breast lumpectomies in left breast (per patient).  Past Medical History - Lower Back / Degenerative Disk In physical therapy once a week, for her lower back. Has degenerative disk in her lower back. Sees Dr. Rolena Infante at [place] / Lumpectomies Doing a biopsy on her right breast in the near future. History of benign lumpectomy. Has been told she has dense breasts. Patient reports two lumpectomies on her left breast, and fears having one in her right breast. Dr. Dalbert Batman was her surgeon for her last lumpectomy. She has been told that her cells are abnormal and they don't know if there's cancer or not, so they take out whatever's there so she won't get cancer.  - GAD She has a history of depression starting when her father was sick. Father died two years ago, and she had to manage his illness and caretaking leading up to that point.  Historically has taken Wellbutrin for her depression.  Takes  300 mg daily, for the past couple of years. She notes that she doesn't normally break down.  SHe is having emotionally diff time now due to poor prognosis of husband finding out he has colon ca and declining txmnt.  Pt is crying uncontrollably at one point in office.   Has restless leg syndrome and receives klonopin from Dr. Arelia Sneddon office. She's taken klonopin about all of her life, historically also to treat her IBS when she was younger.  Takes it now for stress and sleep. Has not had IBS flare up in a while.  The patient does not have a counselor or someone she speaks to about her anxieties.  She does attend church. Right now she speaks with her sister, her doctors, and she is considering contacting her pastor for counseling.  - HLD Is on cholesterol medications.  - Bone Density Per patient, has osteopenia - notes that she had a bone density test in the last few years.   Wt Readings from Last 3 Encounters:  08/30/17 190 lb 6.4 oz (86.4 kg)  04/20/16 191 lb (86.6 kg)   BP Readings from Last 3 Encounters:  08/30/17 136/82  04/20/16 (!) 157/75   Pulse Readings from Last 3 Encounters:  08/30/17 60  04/20/16 73   BMI Readings from Last 3 Encounters:  08/30/17 34.00 kg/m  04/20/16 32.79 kg/m    Patient Care Team    Relationship Specialty Notifications Start End  Mellody Dance, DO PCP - General Family Medicine  08/30/17   Leonard Downing, MD Referring Physician Family Medicine  08/30/17 08/30/17  Melina Schools, MD Consulting Physician Orthopedic Surgery  08/30/17    Comment: back pain  Clarene Essex, MD Consulting Physician Gastroenterology  08/30/17   Danella Sensing, MD Consulting Physician Dermatology  08/30/17   Bjorn Loser, MD Consulting Physician Urology  08/30/17   Rutherford Guys, MD Consulting Physician Ophthalmology  08/30/17   Berneda Rose, DO    08/30/17   Diana Eves, MD Referring Physician Family Medicine  08/30/17 08/30/17  Fanny Skates, [place] Physician General Surgery  08/30/17     Patient Active Problem List   Diagnosis Date Noted  . Hypertension 08/30/2017    Priority: High  . Mixed hyperlipidemia 12/12/2009    Priority: High  . History of smoking 10-25 pack years- 20pk yr hx- quit age 70 08/30/2017    Priority: Low  . GAD (generalized anxiety disorder) 08/30/2017  . Depression, major, single episode, moderate (  [place]) 08/30/2017  . Restless leg syndrome 08/30/2017  . Abnormal mammogram of left breast 04/20/2016  . Fatigue 12/12/2009  . SNORING 12/12/2009     Past Medical History:  Diagnosis Date  . Abnormal mammogram of left breast 04/20/2016  . Anxiety   . Borderline high cholesterol   . Hyperlipidemia   . Hypertension   . Restless leg syndrome      Past Medical History:  Diagnosis Date  . Abnormal mammogram of left breast 04/20/2016  . Anxiety   . Borderline high cholesterol   . Hyperlipidemia   . Hypertension   . Restless leg syndrome      Past Surgical History:  Procedure Laterality Date  . ABDOMINAL HYSTERECTOMY    . BREAST BIOPSY Left 03/15/2016  . BREAST EXCISIONAL BIOPSY Left 04/20/2016  . BREAST EXCISIONAL BIOPSY Left 1995  . BREAST LUMPECTOMY WITH RADIOACTIVE SEED LOCALIZATION Left 04/20/2016   Procedure: LEFT BREAST LUMPECTOMY WITH RADIOACTIVE SEED LOCALIZATION;  Surgeon: Fanny Skates, MD;  Location: [place];  Service: General;  Laterality: Left;  . TONSILLECTOMY       Family History  Problem Relation Age of Onset  . Cancer Mother        breast  . Cancer Father        skin  . Hyperlipidemia Father   . Hypertension Father   . Parkinson's disease Father   . Alcohol abuse Brother   . Depression Brother   . Hyperlipidemia Brother   . Heart disease Maternal Grandmother   . Diabetes Maternal Grandmother      Social History   Substance and Sexual Activity  Drug Use No     Social History   Substance and Sexual Activity  Alcohol Use Yes  .  Alcohol/week: 6.0 oz  . Types: 10 Standard drinks or equivalent per week     Social History   Tobacco Use  Smoking Status Former Smoker  . Years: 20.00  . Types: Cigarettes  . Last attempt to quit: 1988  . Years since quitting: 31.1  Smokeless Tobacco Never Used     Current Meds  Medication Sig  . acetaminophen (ACETAMINOPHEN 8 HOUR) 650 MG CR tablet Take 650 mg by mouth every 8 (eight) hours as needed for pain.  Marland Kitchen aspirin EC 81 MG tablet Take 81 mg by mouth daily.  Marland Kitchen atorvastatin (LIPITOR) 20 MG tablet Take 0.5 tablets by mouth daily.  Marland Kitchen buPROPion (WELLBUTRIN XL) 150 MG 24 hr tablet Take 150 mg by mouth daily.  . Cholecalciferol (VITAMIN D3) 5000 units CAPS Take 1 capsule by mouth.  . clonazePAM (KLONOPIN) 1 MG tablet Take 1 tablet by mouth at bedtime.  . Coenzyme Q10 (COQ10 PO) Take 1 tablet by mouth daily.  . cyclobenzaprine (FLEXERIL) 10 MG tablet Take 1 tablet by mouth daily as needed.  Mariane Baumgarten Calcium (STOOL SOFTENER PO) Take 1 tablet by mouth daily.  Marland Kitchen esomeprazole (NEXIUM) 20 MG capsule Take 20 mg by mouth daily at 12 noon.  Marland Kitchen ibuprofen (ADVIL,MOTRIN) 800 MG tablet Take 800 mg by mouth every 8 (eight) hours as needed.  . loratadine (CLARITIN) 10 MG tablet Take 10 mg by mouth daily.  . metoprolol succinate (TOPROL-XL) 100 MG 24 hr tablet Take 1 tablet by mouth daily.  . Multiple Vitamin (MULTIVITAMIN) capsule Take 1 capsule by mouth daily.  . Omega-3 Fatty Acids (FISH OIL PO) Take 1 capsule by mouth daily.  . Pumpkin Seed-Soy Germ (AZO BLADDER CONTROL/GO-LESS PO) Take by mouth.  Marland Kitchen  Simethicone (GAS-X PO) Take 1 tablet by mouth daily.  . SUPER B COMPLEX/C PO Take 1 capsule by mouth daily.    Allergies: Latex   Review of Systems  Constitutional: Negative for chills, diaphoresis, fever, malaise/fatigue and weight loss.  HENT: Positive for tinnitus. Negative for congestion and sore throat.   Eyes: Negative for blurred vision, double vision and photophobia.    Respiratory: Negative for cough and wheezing.   Cardiovascular: Negative for chest pain and palpitations.  Gastrointestinal: Negative for blood in stool, diarrhea, nausea and vomiting.  Genitourinary: Negative for dysuria, frequency and urgency.       Leaking urine  Musculoskeletal: Positive for back pain, joint pain and myalgias.       Generalized muscle/ jt pain  Skin: Negative for itching and rash.  Neurological: Negative for dizziness, focal weakness, weakness and headaches.  Endo/Heme/Allergies: Negative for environmental allergies and polydipsia. Does not bruise/bleed easily.  Psychiatric/Behavioral: Positive for depression. Negative for suicidal ideas. The patient is nervous/anxious and has insomnia.        Concerns w memory     Objective:   Blood pressure 136/82, pulse 60, height 5' 2.75" (1.594 m), weight 190 lb 6.4 oz (86.4 kg), SpO2 98 %. Body mass index is 34 kg/m. General: Well Developed, well nourished, and in ++ acute distress- tearful, crying during OV Neuro: Alert and oriented x3, extra-ocular muscles intact, sensation grossly intact.  HEENT:[place]/AT, PERRLA, neck supple, No carotid bruits Skin: no gross rashes  Cardiac: Regular rate and rhythm Respiratory: Essentially clear to auscultation bilaterally. Not using accessory muscles, speaking in full sentences.  Abdominal: not grossly distended Musculoskeletal: Ambulates w/o diff, FROM * 4 ext.  Vasc: less 2 sec cap RF, warm and pink  Psych:  No HI/SI, judgement and insight good, agitated, emotionally labile   No results found for this or any previous visit (from the past 2160 hour(s)).

## 2017-08-31 LAB — COMPREHENSIVE METABOLIC PANEL
ALT: 21 IU/L (ref 0–32)
AST: 19 IU/L (ref 0–40)
Albumin/Globulin Ratio: 2.3 — ABNORMAL HIGH (ref 1.2–2.2)
Albumin: 5 g/dL — ABNORMAL HIGH (ref 3.5–4.8)
Alkaline Phosphatase: 83 IU/L (ref 39–117)
BUN/Creatinine Ratio: 16 (ref 12–28)
BUN: 15 mg/dL (ref 8–27)
Bilirubin Total: 0.6 mg/dL (ref 0.0–1.2)
CO2: 23 mmol/L (ref 20–29)
CREATININE: 0.92 mg/dL (ref 0.57–1.00)
Calcium: 10.2 mg/dL (ref 8.7–10.3)
Chloride: 102 mmol/L (ref 96–106)
GFR, EST AFRICAN AMERICAN: 73 mL/min/{1.73_m2} (ref >59)
GFR, EST NON AFRICAN AMERICAN: 63 mL/min/{1.73_m2} (ref >59)
GLOBULIN, TOTAL: 2.2 g/dL (ref 1.5–4.5)
Glucose: 107 mg/dL — ABNORMAL HIGH (ref 65–99)
Potassium: 5 mmol/L (ref 3.5–5.2)
SODIUM: 143 mmol/L (ref 134–144)
Total Protein: 7.2 g/dL (ref 6.0–8.5)

## 2017-08-31 LAB — CBC WITH DIFFERENTIAL/PLATELET
BASOS: 1 %
Basophils Absolute: 0.1 10*3/uL (ref 0.0–0.2)
EOS (ABSOLUTE): 0.2 10*3/uL (ref 0.0–0.4)
EOS: 3 %
HEMATOCRIT: 42.5 % (ref 34.0–46.6)
Hemoglobin: 14.1 g/dL (ref 11.1–15.9)
Immature Grans (Abs): 0 10*3/uL (ref 0.0–0.1)
Immature Granulocytes: 0 %
Lymphocytes Absolute: 1.8 10*3/uL (ref 0.7–3.1)
Lymphs: 26 %
MCH: 29.7 pg (ref 26.6–33.0)
MCHC: 33.2 g/dL (ref 31.5–35.7)
MCV: 90 fL (ref 79–97)
Monocytes Absolute: 0.5 10*3/uL (ref 0.1–0.9)
Monocytes: 7 %
NEUTROS PCT: 63 %
Neutrophils Absolute: 4.3 10*3/uL (ref 1.4–7.0)
Platelets: 237 10*3/uL (ref 150–379)
RBC: 4.74 x10E6/uL (ref 3.77–5.28)
RDW: 13.8 % (ref 12.3–15.4)
WBC: 6.8 10*3/uL (ref 3.4–10.8)

## 2017-08-31 LAB — T4, FREE: Free T4: 1.32 ng/dL (ref 0.82–1.77)

## 2017-08-31 LAB — LIPID PANEL
CHOLESTEROL TOTAL: 251 mg/dL — AB (ref 100–199)
Chol/HDL Ratio: 3.8 ratio (ref 0.0–4.4)
HDL: 66 mg/dL (ref >39)
LDL CALC: 142 mg/dL — AB (ref 0–99)
Triglycerides: 214 mg/dL — ABNORMAL HIGH (ref 0–149)
VLDL CHOLESTEROL CAL: 43 mg/dL — AB (ref 5–40)

## 2017-08-31 LAB — HEMOGLOBIN A1C
ESTIMATED AVERAGE GLUCOSE: 108 mg/dL
HEMOGLOBIN A1C: 5.4 % (ref 4.8–5.6)

## 2017-08-31 LAB — VITAMIN D 25 HYDROXY (VIT D DEFICIENCY, FRACTURES): Vit D, 25-Hydroxy: 31.4 ng/mL (ref 30.0–100.0)

## 2017-08-31 LAB — TSH: TSH: 1.87 u[IU]/mL (ref 0.450–4.500)

## 2017-09-02 ENCOUNTER — Ambulatory Visit
Admission: RE | Admit: 2017-09-02 | Discharge: 2017-09-02 | Disposition: A | Discharge status: Home / Self Care | Payer: Medicare Other | Source: Ambulatory Visit | Attending: General Surgery | Admitting: General Surgery

## 2017-09-02 ENCOUNTER — Other Ambulatory Visit: Payer: Self-pay | Admitting: General Surgery

## 2017-09-02 DIAGNOSIS — R928 Other abnormal and inconclusive findings on diagnostic imaging of breast: Secondary | ICD-10-CM | POA: Diagnosis not present

## 2017-09-02 DIAGNOSIS — N6489 Other specified disorders of breast: Secondary | ICD-10-CM

## 2017-09-02 DIAGNOSIS — N6011 Diffuse cystic mastopathy of right breast: Secondary | ICD-10-CM | POA: Diagnosis not present

## 2017-09-05 ENCOUNTER — Telehealth: Payer: Self-pay | Admitting: Family Medicine

## 2017-09-05 NOTE — Telephone Encounter (Signed)
Called spoke to patient's husband - patient is aware of results. MPulliam, CMA/RT(R)

## 2017-09-05 NOTE — Telephone Encounter (Signed)
Patient left VM during lunch that she received a MyChart notification that her breast biopsy results are in but unavailable to view, she would like to know the results ASAP.

## 2017-09-19 ENCOUNTER — Other Ambulatory Visit: Payer: Self-pay | Admitting: General Surgery

## 2017-09-19 DIAGNOSIS — Z803 Family history of malignant neoplasm of breast: Secondary | ICD-10-CM | POA: Diagnosis not present

## 2017-09-19 DIAGNOSIS — H2511 Age-related nuclear cataract, right eye: Secondary | ICD-10-CM | POA: Diagnosis not present

## 2017-09-19 DIAGNOSIS — Z9889 Other specified postprocedural states: Secondary | ICD-10-CM | POA: Diagnosis not present

## 2017-09-19 DIAGNOSIS — E785 Hyperlipidemia, unspecified: Secondary | ICD-10-CM | POA: Diagnosis not present

## 2017-09-19 DIAGNOSIS — R928 Other abnormal and inconclusive findings on diagnostic imaging of breast: Secondary | ICD-10-CM | POA: Diagnosis not present

## 2017-09-19 DIAGNOSIS — Z961 Presence of intraocular lens: Secondary | ICD-10-CM | POA: Diagnosis not present

## 2017-09-19 DIAGNOSIS — I10 Essential (primary) hypertension: Secondary | ICD-10-CM | POA: Diagnosis not present

## 2017-09-19 DIAGNOSIS — G2581 Restless legs syndrome: Secondary | ICD-10-CM | POA: Diagnosis not present

## 2017-09-21 ENCOUNTER — Other Ambulatory Visit: Payer: Self-pay | Admitting: General Surgery

## 2017-09-21 DIAGNOSIS — R928 Other abnormal and inconclusive findings on diagnostic imaging of breast: Secondary | ICD-10-CM

## 2017-09-27 ENCOUNTER — Other Ambulatory Visit: Payer: Self-pay

## 2017-09-27 ENCOUNTER — Encounter (HOSPITAL_BASED_OUTPATIENT_CLINIC_OR_DEPARTMENT_OTHER): Payer: Self-pay | Admitting: *Deleted

## 2017-09-28 ENCOUNTER — Encounter (HOSPITAL_BASED_OUTPATIENT_CLINIC_OR_DEPARTMENT_OTHER)
Admission: RE | Admit: 2017-09-28 | Discharge: 2017-09-28 | Disposition: A | Discharge status: Home / Self Care | Payer: Medicare Other | Source: Ambulatory Visit | Attending: General Surgery | Admitting: General Surgery

## 2017-09-28 DIAGNOSIS — Z01810 Encounter for preprocedural cardiovascular examination: Secondary | ICD-10-CM | POA: Diagnosis not present

## 2017-09-28 DIAGNOSIS — Z01812 Encounter for preprocedural laboratory examination: Secondary | ICD-10-CM | POA: Insufficient documentation

## 2017-09-28 LAB — CBC WITH DIFFERENTIAL/PLATELET
Basophils Absolute: 0.1 10*3/uL (ref 0.0–0.1)
Basophils Relative: 1 %
EOS PCT: 3 %
Eosinophils Absolute: 0.2 10*3/uL (ref 0.0–0.7)
HEMATOCRIT: 39.1 % (ref 36.0–46.0)
Hemoglobin: 12.9 g/dL (ref 12.0–15.0)
LYMPHS ABS: 1.8 10*3/uL (ref 0.7–4.0)
LYMPHS PCT: 35 %
MCH: 29.8 pg (ref 26.0–34.0)
MCHC: 33 g/dL (ref 30.0–36.0)
MCV: 90.3 fL (ref 78.0–100.0)
MONO ABS: 0.4 10*3/uL (ref 0.1–1.0)
MONOS PCT: 9 %
NEUTROS ABS: 2.7 10*3/uL (ref 1.7–7.7)
Neutrophils Relative %: 52 %
PLATELETS: 178 10*3/uL (ref 150–400)
RBC: 4.33 MIL/uL (ref 3.87–5.11)
RDW: 13.1 % (ref 11.5–15.5)
WBC: 5.2 10*3/uL (ref 4.0–10.5)

## 2017-09-28 LAB — COMPREHENSIVE METABOLIC PANEL
ALT: 23 U/L (ref 14–54)
AST: 19 U/L (ref 15–41)
Albumin: 3.8 g/dL (ref 3.5–5.0)
Alkaline Phosphatase: 56 U/L (ref 38–126)
Anion gap: 8 (ref 5–15)
BILIRUBIN TOTAL: 0.9 mg/dL (ref 0.3–1.2)
BUN: 12 mg/dL (ref 6–20)
CHLORIDE: 108 mmol/L (ref 101–111)
CO2: 23 mmol/L (ref 22–32)
CREATININE: 0.84 mg/dL (ref 0.44–1.00)
Calcium: 9 mg/dL (ref 8.9–10.3)
Glucose, Bld: 84 mg/dL (ref 65–99)
POTASSIUM: 4.3 mmol/L (ref 3.5–5.1)
Sodium: 139 mmol/L (ref 135–145)
Total Protein: 6.1 g/dL — ABNORMAL LOW (ref 6.5–8.1)

## 2017-09-28 NOTE — Progress Notes (Signed)
EKG reviewed by Dr. Foster, will proceed with surgery as scheduled. 

## 2017-09-30 ENCOUNTER — Ambulatory Visit
Admission: RE | Admit: 2017-09-30 | Discharge: 2017-09-30 | Disposition: A | Discharge status: Home / Self Care | Payer: Medicare Other | Source: Ambulatory Visit | Attending: General Surgery | Admitting: General Surgery

## 2017-09-30 DIAGNOSIS — R928 Other abnormal and inconclusive findings on diagnostic imaging of breast: Secondary | ICD-10-CM

## 2017-10-02 NOTE — Anesthesia Preprocedure Evaluation (Addendum)
Anesthesia Evaluation  Patient identified by MRN, date of birth, ID band Patient awake  General Assessment Comment:10/17 Laryngoscope Size: Glidescope and 4 Grade View: Grade III Tube type: Oral Tube size: 7.0 mm Number of attempts: 2 Airway Equipment and Method: Stylet and Video-laryngoscopy Placement Confirmation: ETT inserted through vocal cords under direct vision,  positive ETCO2 and breath sounds checked- equal and bilateral Secured at: 22 cm   Difficulty Due To: due to limited oral opening, Difficult Airway- due to dentition and Difficult Airway- due to anterior larynx     Reviewed: Allergy & Precautions, NPO status , Patient's Chart, lab work & pertinent test results  History of Anesthesia Complications (+) DIFFICULT AIRWAY and history of anesthetic complications  Airway Mallampati: II  TM Distance: >3 FB Neck ROM: Full    Dental no notable dental hx.    Pulmonary neg pulmonary ROS, former smoker,    Pulmonary exam normal        Cardiovascular hypertension, Pt. on medications and Pt. on home beta blockers negative cardio ROS Normal cardiovascular exam     Neuro/Psych PSYCHIATRIC DISORDERS Anxiety Depression Restless leg syndrome negative neurological ROS  negative psych ROS   GI/Hepatic negative GI ROS, Neg liver ROS, GERD  Medicated and Poorly Controlled,  Endo/Other  negative endocrine ROS  Renal/GU negative Renal ROS  negative genitourinary   Musculoskeletal negative musculoskeletal ROS (+)   Abdominal   Peds negative pediatric ROS (+)  Hematology negative hematology ROS (+)   Anesthesia Other Findings   Reproductive/Obstetrics negative OB ROS                            Anesthesia Physical  Anesthesia Plan  ASA: II  Anesthesia Plan: General   Post-op Pain Management:    Induction: Intravenous  PONV Risk Score and Plan: 2 and Ondansetron and Treatment may vary  due to age or medical condition  Airway Management Planned: Oral ETT, LMA and Video Laryngoscope Planned  Additional Equipment:   Intra-op Plan:   Post-operative Plan: Extubation in OR  Informed Consent: I have reviewed the patients History and Physical, chart, labs and discussed the procedure including the risks, benefits and alternatives for the proposed anesthesia with the patient or authorized representative who has indicated his/her understanding and acceptance.   Dental advisory given  Plan Discussed with: CRNA, Anesthesiologist and Surgeon  Anesthesia Plan Comments: ( )       Anesthesia Quick Evaluation

## 2017-10-02 NOTE — H&P (Signed)
Tara [name] Location: [facility] Patient #: [id number] DOB: 06-29-1946 Married / Language: English / Race: White Female       History of Present Illness  The patient is a 72 year old female who presents with a breast mass. This is a 72 year old female who returns to see me because of an abnormal mammogram of the right breast. Her PCP is Dr. Raliegh Scarlet. She was referred by [name] at [facility].  She has a remote history of left breast biopsy for benign cyst. 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 She did well. Recent mammograms, September 02, 2017 showed architectural distortion in the right breast upper outer quadrant. Core biopsy upper-outer right breast shows no malignancy. Usual ductal hyperplasia. Fibrocystic change. [name] felt this was discordant and told the patient she need to do have this area removed. The patient is a little bit frustrated but inclined to go ahead with the surgery. I told her that it would be a good idea for her to get a high risk evaluation and genetic testing possibly after we clarify her diagnosis  Comorbidities include hypertension, 2 prior left breast biopsies. Restless leg syndrome, hyperlipidemia. Family history reveals mother died of metastatic breast cancer at age 79. 2 years following diagnosis. Grandmother had colon cancer. Social history she is married. No children. 2 step children. Denies tobacco. 2 alcoholic beverages a day. A major issue at this time is that the patient's husband has had a colonoscopy which shows colon cancer and he has decided not to have surgery. This is very stressful to the patient. He is apparently not symptomatic. He has had a PET scan and the patient told her he had no metastatic disease. She was to get her health problems settled so that she can turn her attention to him. Told her this  was reasonable although there was no emergency about her breast biopsy. She does that there is probably less than 10% chance she has breast cancer  She will be scheduled for right breast lumpectomy with radioactive seed localization. I discussed the indications, details, techniques, and numerous risk of surgery with her. She is aware the risks of bleeding, infection, reoperation if cancer is found, cosmetic deformity. Chronic pain from nerve damage and other unforeseen problems. She of course understands these issues well and all of her questions were answered and she agrees with this plan.     Allergies  Latex Gloves One Size *MEDICAL DEVICES AND SUPPLIES*  Allergies Reconciled   Medication History  AZO Bladder Control/Go-Less (Oral) Active. Tobramycin-Dexamethasone (0.3-0.1% Suspension, Ophthalmic) Active. Atorvastatin Calcium (10MG  Tablet, Oral) Active. BuPROPion HCl ER (XL) (300MG  Tablet ER 24HR, Oral) Active. Atorvastatin Calcium (20MG  Tablet, Oral) Active. Metoprolol Succinate ER (100MG  Tablet ER 24HR, Oral) Active. Aspirin (81MG  Tablet Chewable, Oral) Active. Medications Reconciled  Vitals Weight: 190.5 lb Height: 64in Body Surface Area: 1.92 m Body Mass Index: 32.7 kg/m  Temp.: 98.75F(Oral)  Pulse: 76 (Regular)  BP: 138/92 (Sitting, Right Arm, Standard)    Physical Exam  General Mental Status-Alert. General Appearance-Not in acute distress. Build & Nutrition-Well nourished. Posture-Normal posture. Gait-Normal.  Head and Neck Head-normocephalic, atraumatic with no lesions or palpable masses. Trachea-midline. Thyroid Gland Characteristics - normal size and consistency and no palpable nodules.  Chest and Lung Exam Chest and lung exam reveals -on auscultation, normal breath sounds, no adventitious sounds and normal vocal resonance.  Breast Note: Right breast reveals recent  needle biopsy site well healed. No hematoma. No  palpable mass. Nipple and areola complex is normal. No axillary adenopathy. Left breast reveals transverse lumpectomy scar in the 12 o'clock position.. No mass or skin changes otherwise. No adenopathy.   Cardiovascular Cardiovascular examination reveals -normal heart sounds, regular rate and rhythm with no murmurs and femoral artery auscultation bilaterally reveals normal pulses, no bruits, no thrills.  Abdomen Inspection Inspection of the abdomen reveals - No Hernias. Palpation/Percussion Palpation and Percussion of the abdomen reveal - Soft, Non Tender, No Rigidity (guarding), No hepatosplenomegaly and No Palpable abdominal masses.  Neurologic Neurologic evaluation reveals -alert and oriented x 3 with no impairment of recent or remote memory, normal attention span and ability to concentrate, normal sensation and normal coordination.  Musculoskeletal Normal Exam - Bilateral-Upper Extremity Strength Normal and Lower Extremity Strength Normal.    Assessment & Plan  ABNORMAL MAMMOGRAM OF RIGHT BREAST (R92.8)   Your recent mammograms and biopsy showed an area of distortion in the upper outer right breast. The biopsy does not show cancer, but the radiologist is very concerned and thinks this area needs to be excised due to the risk of cancer I agree The chance of this being cancer is probably 10% or less, however. You are at increased risk because your mother died of metastatic breast cancer At some point I think you need to be evaluated by the high risk breast clinic and consider genetic testing  You have told me that he would like to proceed with right breast lumpectomy because of your husband's medical problems and that you wanted to get this out of the way That is appropriate medically  you will be scheduled for right breast lumpectomy with radioactive seed localization We have discussed the indications, techniques, and risks of the surgery in detail  HISTORY OF LEFT  BREAST BIOPSY (Z98.890) FAMILY HISTORY OF BREAST CANCER IN FIRST DEGREE RELATIVE (Z80.3) HYPERTENSION, ESSENTIAL (I10) RESTLESS LEG SYNDROME (G25.81) HYPERLIPIDEMIA, MILD (E78.5)

## 2017-10-03 ENCOUNTER — Encounter (HOSPITAL_BASED_OUTPATIENT_CLINIC_OR_DEPARTMENT_OTHER): Payer: Self-pay | Admitting: Anesthesiology

## 2017-10-03 ENCOUNTER — Encounter (HOSPITAL_BASED_OUTPATIENT_CLINIC_OR_DEPARTMENT_OTHER)
Admission: RE | Disposition: A | Discharge status: Home / Self Care | Payer: Self-pay | Source: Ambulatory Visit | Attending: General Surgery

## 2017-10-03 ENCOUNTER — Ambulatory Visit (HOSPITAL_BASED_OUTPATIENT_CLINIC_OR_DEPARTMENT_OTHER): Payer: Medicare Other | Admitting: Anesthesiology

## 2017-10-03 ENCOUNTER — Ambulatory Visit
Admission: RE | Admit: 2017-10-03 | Discharge: 2017-10-03 | Disposition: A | Discharge status: Home / Self Care | Payer: Medicare Other | Source: Ambulatory Visit | Attending: General Surgery | Admitting: General Surgery

## 2017-10-03 ENCOUNTER — Ambulatory Visit (HOSPITAL_BASED_OUTPATIENT_CLINIC_OR_DEPARTMENT_OTHER)
Admission: RE | Admit: 2017-10-03 | Discharge: 2017-10-03 | Disposition: A | Discharge status: Home / Self Care | Payer: Medicare Other | Source: Ambulatory Visit | Attending: General Surgery | Admitting: General Surgery

## 2017-10-03 DIAGNOSIS — F418 Other specified anxiety disorders: Secondary | ICD-10-CM | POA: Diagnosis not present

## 2017-10-03 DIAGNOSIS — E785 Hyperlipidemia, unspecified: Secondary | ICD-10-CM | POA: Insufficient documentation

## 2017-10-03 DIAGNOSIS — I10 Essential (primary) hypertension: Secondary | ICD-10-CM | POA: Insufficient documentation

## 2017-10-03 DIAGNOSIS — Z7982 Long term (current) use of aspirin: Secondary | ICD-10-CM | POA: Insufficient documentation

## 2017-10-03 DIAGNOSIS — N6091 Unspecified benign mammary dysplasia of right breast: Secondary | ICD-10-CM | POA: Insufficient documentation

## 2017-10-03 DIAGNOSIS — Z803 Family history of malignant neoplasm of breast: Secondary | ICD-10-CM | POA: Insufficient documentation

## 2017-10-03 DIAGNOSIS — Z79899 Other long term (current) drug therapy: Secondary | ICD-10-CM | POA: Insufficient documentation

## 2017-10-03 DIAGNOSIS — Z91040 Latex allergy status: Secondary | ICD-10-CM | POA: Diagnosis not present

## 2017-10-03 DIAGNOSIS — G2581 Restless legs syndrome: Secondary | ICD-10-CM | POA: Insufficient documentation

## 2017-10-03 DIAGNOSIS — R928 Other abnormal and inconclusive findings on diagnostic imaging of breast: Secondary | ICD-10-CM | POA: Diagnosis present

## 2017-10-03 DIAGNOSIS — E782 Mixed hyperlipidemia: Secondary | ICD-10-CM | POA: Diagnosis not present

## 2017-10-03 DIAGNOSIS — N6011 Diffuse cystic mastopathy of right breast: Secondary | ICD-10-CM | POA: Diagnosis not present

## 2017-10-03 DIAGNOSIS — N6489 Other specified disorders of breast: Secondary | ICD-10-CM | POA: Diagnosis not present

## 2017-10-03 HISTORY — PX: BREAST LUMPECTOMY WITH RADIOACTIVE SEED LOCALIZATION: SHX6424

## 2017-10-03 HISTORY — DX: Unspecified lump in the right breast, unspecified quadrant: N63.10

## 2017-10-03 HISTORY — DX: Gastro-esophageal reflux disease without esophagitis: K21.9

## 2017-10-03 SURGERY — BREAST LUMPECTOMY WITH RADIOACTIVE SEED LOCALIZATION
Anesthesia: General | Site: Breast | Laterality: Right

## 2017-10-03 MED ORDER — OXYCODONE HCL 5 MG/5ML PO SOLN: 5.0000 mg | Freq: Once | ORAL | Status: DC | PRN

## 2017-10-03 MED ORDER — DEXAMETHASONE SODIUM PHOSPHATE 4 MG/ML IJ SOLN
INTRAMUSCULAR | Status: DC | PRN
  Administered 2017-10-03: 10 mg via INTRAVENOUS

## 2017-10-03 MED ORDER — BUPIVACAINE-EPINEPHRINE (PF) 0.5% -1:200000 IJ SOLN
INTRAMUSCULAR | Status: DC | PRN
  Administered 2017-10-03: 20 mL

## 2017-10-03 MED ORDER — 0.9 % SODIUM CHLORIDE (POUR BTL) OPTIME
TOPICAL | Status: DC | PRN
  Administered 2017-10-03: 1000 mL

## 2017-10-03 MED ORDER — FENTANYL CITRATE (PF) 100 MCG/2ML IJ SOLN
INTRAMUSCULAR | Status: AC
  Filled 2017-10-03: qty 2

## 2017-10-03 MED ORDER — ACETAMINOPHEN 500 MG PO TABS
ORAL_TABLET | ORAL | Status: AC
  Filled 2017-10-03: qty 2

## 2017-10-03 MED ORDER — GABAPENTIN 300 MG PO CAPS
300.0000 mg | ORAL_CAPSULE | ORAL | Status: AC
  Administered 2017-10-03: 300 mg via ORAL

## 2017-10-03 MED ORDER — CHLORHEXIDINE GLUCONATE CLOTH 2 % EX PADS: 6.0000 | MEDICATED_PAD | Freq: Once | CUTANEOUS | Status: DC

## 2017-10-03 MED ORDER — KETOROLAC TROMETHAMINE 30 MG/ML IJ SOLN: 30.0000 mg | Freq: Once | INTRAMUSCULAR | Status: DC | PRN

## 2017-10-03 MED ORDER — OXYCODONE HCL 5 MG PO TABS: 5.0000 mg | ORAL_TABLET | Freq: Once | ORAL | Status: DC | PRN

## 2017-10-03 MED ORDER — FENTANYL CITRATE (PF) 100 MCG/2ML IJ SOLN: 25.0000 ug | INTRAMUSCULAR | Status: DC | PRN

## 2017-10-03 MED ORDER — PROPOFOL 10 MG/ML IV BOLUS
INTRAVENOUS | Status: DC | PRN
  Administered 2017-10-03: 150 mg via INTRAVENOUS
  Administered 2017-10-03: 50 mg via INTRAVENOUS

## 2017-10-03 MED ORDER — ACETAMINOPHEN 325 MG PO TABS: 325.0000 mg | ORAL_TABLET | ORAL | Status: DC | PRN

## 2017-10-03 MED ORDER — DEXAMETHASONE SODIUM PHOSPHATE 10 MG/ML IJ SOLN
INTRAMUSCULAR | Status: AC
  Filled 2017-10-03: qty 1

## 2017-10-03 MED ORDER — ONDANSETRON HCL 4 MG/2ML IJ SOLN: 4.0000 mg | Freq: Once | INTRAMUSCULAR | Status: DC | PRN

## 2017-10-03 MED ORDER — EPHEDRINE SULFATE 50 MG/ML IJ SOLN
INTRAMUSCULAR | Status: DC | PRN
  Administered 2017-10-03: 10 mg via INTRAVENOUS

## 2017-10-03 MED ORDER — GLYCOPYRROLATE 0.2 MG/ML IJ SOLN
INTRAMUSCULAR | Status: DC | PRN
  Administered 2017-10-03 (×2): 0.1 mg via INTRAVENOUS

## 2017-10-03 MED ORDER — CELECOXIB 200 MG PO CAPS
200.0000 mg | ORAL_CAPSULE | ORAL | Status: AC
  Administered 2017-10-03: 200 mg via ORAL

## 2017-10-03 MED ORDER — CEFAZOLIN SODIUM-DEXTROSE 2-4 GM/100ML-% IV SOLN
2.0000 g | INTRAVENOUS | Status: AC
  Administered 2017-10-03: 2 g via INTRAVENOUS

## 2017-10-03 MED ORDER — ESMOLOL HCL 100 MG/10ML IV SOLN
INTRAVENOUS | Status: AC
  Filled 2017-10-03: qty 10

## 2017-10-03 MED ORDER — CEFAZOLIN SODIUM-DEXTROSE 2-4 GM/100ML-% IV SOLN
INTRAVENOUS | Status: AC
  Filled 2017-10-03: qty 100

## 2017-10-03 MED ORDER — FENTANYL CITRATE (PF) 100 MCG/2ML IJ SOLN: 50.0000 ug | INTRAMUSCULAR | Status: DC | PRN

## 2017-10-03 MED ORDER — CELECOXIB 200 MG PO CAPS
ORAL_CAPSULE | ORAL | Status: AC
  Filled 2017-10-03: qty 1

## 2017-10-03 MED ORDER — ACETAMINOPHEN 500 MG PO TABS
1000.0000 mg | ORAL_TABLET | ORAL | Status: AC
  Administered 2017-10-03: 1000 mg via ORAL

## 2017-10-03 MED ORDER — LIDOCAINE HCL (CARDIAC) 20 MG/ML IV SOLN
INTRAVENOUS | Status: AC
  Filled 2017-10-03: qty 5

## 2017-10-03 MED ORDER — PHENYLEPHRINE 40 MCG/ML (10ML) SYRINGE FOR IV PUSH (FOR BLOOD PRESSURE SUPPORT)
PREFILLED_SYRINGE | INTRAVENOUS | Status: AC
  Filled 2017-10-03: qty 10

## 2017-10-03 MED ORDER — MIDAZOLAM HCL 2 MG/2ML IJ SOLN: 1.0000 mg | INTRAMUSCULAR | Status: DC | PRN

## 2017-10-03 MED ORDER — PROPOFOL 10 MG/ML IV BOLUS
INTRAVENOUS | Status: AC
  Filled 2017-10-03: qty 20

## 2017-10-03 MED ORDER — MEPERIDINE HCL 25 MG/ML IJ SOLN: 6.2500 mg | INTRAMUSCULAR | Status: DC | PRN

## 2017-10-03 MED ORDER — FENTANYL CITRATE (PF) 100 MCG/2ML IJ SOLN
INTRAMUSCULAR | Status: DC | PRN
  Administered 2017-10-03: 50 ug via INTRAVENOUS
  Administered 2017-10-03: 25 ug via INTRAVENOUS
  Administered 2017-10-03: 50 ug via INTRAVENOUS

## 2017-10-03 MED ORDER — ONDANSETRON HCL 4 MG/2ML IJ SOLN
INTRAMUSCULAR | Status: AC
  Filled 2017-10-03: qty 2

## 2017-10-03 MED ORDER — SCOPOLAMINE 1 MG/3DAYS TD PT72: 1.0000 | MEDICATED_PATCH | Freq: Once | TRANSDERMAL | Status: DC | PRN

## 2017-10-03 MED ORDER — HYDROCODONE-ACETAMINOPHEN 5-325 MG PO TABS: 1.0000 | ORAL_TABLET | Freq: Four times a day (QID) | ORAL | 0 refills | Status: DC | PRN

## 2017-10-03 MED ORDER — BUPIVACAINE-EPINEPHRINE (PF) 0.5% -1:200000 IJ SOLN
INTRAMUSCULAR | Status: AC
  Filled 2017-10-03: qty 60

## 2017-10-03 MED ORDER — LIDOCAINE HCL (CARDIAC) 20 MG/ML IV SOLN
INTRAVENOUS | Status: DC | PRN
  Administered 2017-10-03: 30 mg via INTRAVENOUS

## 2017-10-03 MED ORDER — ACETAMINOPHEN 160 MG/5ML PO SOLN: 325.0000 mg | ORAL | Status: DC | PRN

## 2017-10-03 MED ORDER — LACTATED RINGERS IV SOLN
INTRAVENOUS | Status: DC
  Administered 2017-10-03 (×2): via INTRAVENOUS

## 2017-10-03 MED ORDER — GABAPENTIN 300 MG PO CAPS
ORAL_CAPSULE | ORAL | Status: AC
  Filled 2017-10-03: qty 1

## 2017-10-03 SURGICAL SUPPLY — 46 items
ADH SKN CLS APL DERMABOND .7 (GAUZE/BANDAGES/DRESSINGS) ×1
APPLIER CLIP 9.375 MED OPEN (MISCELLANEOUS)
APR CLP MED 9.3 20 MLT OPN (MISCELLANEOUS)
BINDER BREAST XLRG (GAUZE/BANDAGES/DRESSINGS) ×2 IMPLANT
BLADE HEX COATED 2.75 (ELECTRODE) ×3 IMPLANT
BLADE SURG 10 STRL SS (BLADE) IMPLANT
BLADE SURG 15 STRL LF DISP TIS (BLADE) ×1 IMPLANT
BLADE SURG 15 STRL SS (BLADE) ×3
CANISTER SUCT 1200ML W/VALVE (MISCELLANEOUS) ×3 IMPLANT
CHLORAPREP W/TINT 26ML (MISCELLANEOUS) ×3 IMPLANT
CLIP APPLIE 9.375 MED OPEN (MISCELLANEOUS) IMPLANT
COVER BACK TABLE 60X90IN (DRAPES) ×3 IMPLANT
COVER MAYO STAND STRL (DRAPES) ×3 IMPLANT
COVER PROBE W GEL 5X96 (DRAPES) ×3 IMPLANT
DERMABOND ADVANCED (GAUZE/BANDAGES/DRESSINGS) ×2
DERMABOND ADVANCED .7 DNX12 (GAUZE/BANDAGES/DRESSINGS) ×1 IMPLANT
DEVICE DUBIN W/COMP PLATE 8390 (MISCELLANEOUS) ×3 IMPLANT
DRAPE LAPAROSCOPIC ABDOMINAL (DRAPES) ×3 IMPLANT
DRAPE UTILITY XL STRL (DRAPES) ×3 IMPLANT
DRSG PAD ABDOMINAL 8X10 ST (GAUZE/BANDAGES/DRESSINGS) ×2 IMPLANT
ELECT REM PT RETURN 9FT ADLT (ELECTROSURGICAL) ×3
ELECTRODE REM PT RTRN 9FT ADLT (ELECTROSURGICAL) ×1 IMPLANT
GAUZE SPONGE 4X4 12PLY STRL LF (GAUZE/BANDAGES/DRESSINGS) ×2 IMPLANT
GLOVE EUDERMIC 7 POWDERFREE (GLOVE) ×3 IMPLANT
GOWN STRL REUS W/ TWL LRG LVL3 (GOWN DISPOSABLE) ×1 IMPLANT
GOWN STRL REUS W/ TWL XL LVL3 (GOWN DISPOSABLE) ×1 IMPLANT
GOWN STRL REUS W/TWL LRG LVL3 (GOWN DISPOSABLE) ×3
GOWN STRL REUS W/TWL XL LVL3 (GOWN DISPOSABLE) ×3
KIT MARKER MARGIN INK (KITS) ×3 IMPLANT
NDL HYPO 25X1 1.5 SAFETY (NEEDLE) ×1 IMPLANT
NEEDLE HYPO 25X1 1.5 SAFETY (NEEDLE) ×3 IMPLANT
NS IRRIG 1000ML POUR BTL (IV SOLUTION) ×3 IMPLANT
PACK BASIN DAY SURGERY FS (CUSTOM PROCEDURE TRAY) ×3 IMPLANT
PENCIL BUTTON HOLSTER BLD 10FT (ELECTRODE) ×3 IMPLANT
SHEET MEDIUM DRAPE 40X70 STRL (DRAPES) IMPLANT
SLEEVE SCD COMPRESS KNEE MED (MISCELLANEOUS) ×3 IMPLANT
SPONGE LAP 18X18 RF (DISPOSABLE) ×2 IMPLANT
SPONGE LAP 4X18 X RAY DECT (DISPOSABLE) ×3 IMPLANT
SUT MNCRL AB 4-0 PS2 18 (SUTURE) ×3 IMPLANT
SUT SILK 2 0 SH (SUTURE) ×3 IMPLANT
SUT VICRYL 3-0 CR8 SH (SUTURE) ×3 IMPLANT
SYR 10ML LL (SYRINGE) ×3 IMPLANT
TOWEL OR 17X24 6PK STRL BLUE (TOWEL DISPOSABLE) ×3 IMPLANT
TUBE CONNECTING 20'X1/4 (TUBING) ×1
TUBE CONNECTING 20X1/4 (TUBING) ×2 IMPLANT
YANKAUER SUCT BULB TIP NO VENT (SUCTIONS) ×3 IMPLANT

## 2017-10-03 NOTE — Interval H&P Note (Signed)
History and Physical Interval Note:  10/03/2017 7:15 AM  Tara [name]  has presented today for surgery, with the diagnosis of right breast abnormal mammogram  The various methods of treatment have been discussed with the patient and family. After consideration of risks, benefits and other options for treatment, the patient has consented to  Procedure(s): BREAST LUMPECTOMY WITH RADIOACTIVE SEED LOCALIZATION (Right) as a surgical intervention .  The patient's history has been reviewed, patient examined, no change in status, stable for surgery.  I have reviewed the patient's chart and labs.  Questions were answered to the patient's satisfaction.     Adin Hector

## 2017-10-03 NOTE — Transfer of Care (Signed)
Immediate Anesthesia Transfer of Care Note  Patient: Tara [name]  Procedure(s) Performed: BREAST LUMPECTOMY WITH RADIOACTIVE SEED LOCALIZATION (Right Breast)  Patient Location: PACU  Anesthesia Type:General  Level of Consciousness: sedated  Airway & Oxygen Therapy: Patient Spontanous Breathing and Patient connected to face mask oxygen  Post-op Assessment: Report given to RN and Post -op Vital signs reviewed and stable  Post vital signs: Reviewed and stable  Last Vitals:  Vitals:   10/03/17 0631  BP: (!) 129/48  Pulse: 67  Resp: 12  Temp: 36.6 C  SpO2: 98%    Last Pain:  Vitals:   10/03/17 0631  TempSrc: Oral  PainSc: 0-No pain         Complications: No apparent anesthesia complications

## 2017-10-03 NOTE — Anesthesia Procedure Notes (Signed)
Procedure Name: LMA Insertion Date/Time: 10/03/2017 7:36 AM Performed by: Marrianne Mood, CRNA Pre-anesthesia Checklist: Patient identified, Emergency Drugs available, Suction available, Patient being monitored and Timeout performed Patient Re-evaluated:Patient Re-evaluated prior to induction Oxygen Delivery Method: Circle system utilized Preoxygenation: Pre-oxygenation with 100% oxygen Induction Type: IV induction Ventilation: Mask ventilation without difficulty LMA: LMA inserted LMA Size: 4.0 Number of attempts: 1 Airway Equipment and Method: Bite block Placement Confirmation: positive ETCO2 Tube secured with: Tape Dental Injury: Teeth and Oropharynx as per pre-operative assessment

## 2017-10-03 NOTE — Discharge Instructions (Signed)
[facility] °Office Phone Number [phone] ° °BREAST BIOPSY/ PARTIAL MASTECTOMY: POST OP INSTRUCTIONS ° °Always review your discharge instruction sheet given to you by the facility where your surgery was performed. ° °IF YOU HAVE DISABILITY OR FAMILY LEAVE FORMS, YOU MUST BRING THEM TO THE OFFICE FOR PROCESSING.  DO NOT GIVE THEM TO YOUR DOCTOR. ° °1. A prescription for pain medication may be given to you upon discharge.  Take your pain medication as prescribed, if needed.  If narcotic pain medicine is not needed, then you may take acetaminophen (Tylenol) or ibuprofen (Advil) as needed. °2. Take your usually prescribed medications unless otherwise directed °3. If you need a refill on your pain medication, please contact your pharmacy.  They will contact our office to request authorization.  Prescriptions will not be filled after 5pm or on week-ends. °4. You should eat very light the first 24 hours after surgery, such as soup, crackers, pudding, etc.  Resume your normal diet the day after surgery. °5. Most patients will experience some swelling and bruising in the breast.  Ice packs and a good support bra will help.  Swelling and bruising can take several days to resolve.  °6. It is common to experience some constipation if taking pain medication after surgery.  Increasing fluid intake and taking a stool softener will usually help or prevent this problem from occurring.  A mild laxative (Milk of Magnesia or Miralax) should be taken according to package directions if there are no bowel movements after 48 hours. °7. Unless discharge instructions indicate otherwise, you may remove your bandages 24-48 hours after surgery, and you may shower at that time.  You may have steri-strips (small skin tapes) in place directly over the incision.  These strips should be left on the skin for 7-10 days.  If your surgeon used skin glue on the incision, you may shower in 24 hours.  The glue will flake off over the  next 2-3 weeks.  Any sutures or staples will be removed at the office during your follow-up visit. °8. ACTIVITIES:  You may resume regular daily activities (gradually increasing) beginning the next day.  Wearing a good support bra or sports bra minimizes pain and swelling.  You may have sexual intercourse when it is comfortable. °a. You may drive when you no longer are taking prescription pain medication, you can comfortably wear a seatbelt, and you can safely maneuver your car and apply brakes. °b. RETURN TO WORK:  ______________________________________________________________________________________ °9. You should see your doctor in the office for a follow-up appointment approximately two weeks after your surgery.  Your doctor’s nurse will typically make your follow-up appointment when she calls you with your pathology report.  Expect your pathology report 2-3 business days after your surgery.  You may call to check if you do not hear from us after three days. °10. OTHER INSTRUCTIONS: _______________________________________________________________________________________________ _____________________________________________________________________________________________________________________________________ °_____________________________________________________________________________________________________________________________________ °_____________________________________________________________________________________________________________________________________ ° °WHEN TO CALL YOUR DOCTOR: °1. Fever over 101.0 °2. Nausea and/or vomiting. °3. Extreme swelling or bruising. °4. Continued bleeding from incision. °5. Increased pain, redness, or drainage from the incision. ° °The clinic staff is available to answer your questions during regular business hours.  Please don’t hesitate to call and ask to speak to one of the nurses for clinical concerns.  If you have a medical emergency, go to the nearest  emergency room or call 911.  A surgeon from [facility] is always on call at the hospital. ° °For further questions, please visit [email or web address]  ° ° ° ° °  Post Anesthesia Home Care Instructions ° °Activity: °Get plenty of rest for the remainder of the day. A responsible individual must stay with you for 24 hours following the procedure.  °For the next 24 hours, DO NOT: °-Drive a car °-Operate machinery °-Drink alcoholic beverages °-Take any medication unless instructed by your physician °-Make any legal decisions or sign important papers. ° °Meals: °Start with liquid foods such as gelatin or soup. Progress to regular foods as tolerated. Avoid greasy, spicy, heavy foods. If nausea and/or vomiting occur, drink only clear liquids until the nausea and/or vomiting subsides. Call your physician if vomiting continues. ° °Special Instructions/Symptoms: °Your throat may feel dry or sore from the anesthesia or the breathing tube placed in your throat during surgery. If this causes discomfort, gargle with warm salt water. The discomfort should disappear within 24 hours. ° °If you had a scopolamine patch placed behind your ear for the management of post- operative nausea and/or vomiting: ° °1. The medication in the patch is effective for 72 hours, after which it should be removed.  Wrap patch in a tissue and discard in the trash. Wash hands thoroughly with soap and water. °2. You may remove the patch earlier than 72 hours if you experience unpleasant side effects which may include dry mouth, dizziness or visual disturbances. °3. Avoid touching the patch. Wash your hands with soap and water after contact with the patch. °  ° °

## 2017-10-03 NOTE — Anesthesia Postprocedure Evaluation (Signed)
Anesthesia Post Note  Patient: Tara [name]  Procedure(s) Performed: BREAST LUMPECTOMY WITH RADIOACTIVE SEED LOCALIZATION (Right Breast)     Patient location during evaluation: PACU Anesthesia Type: General Level of consciousness: awake and alert Pain management: pain level controlled Vital Signs Assessment: post-procedure vital signs reviewed and stable Respiratory status: spontaneous breathing, nonlabored ventilation, respiratory function stable and patient connected to nasal cannula oxygen Cardiovascular status: blood pressure returned to baseline and stable Postop Assessment: no apparent nausea or vomiting Anesthetic complications: no    Last Vitals:  Vitals:   10/03/17 0828 10/03/17 0845  BP: 108/61 (!) 154/73  Pulse: (!) 108 (!) 124  Resp: 14 12  Temp: (!) 36.4 C   SpO2: 97% 100%    Last Pain:  Vitals:   10/03/17 0631  TempSrc: Oral  PainSc: 0-No pain                 Collen Hostler

## 2017-10-03 NOTE — Op Note (Signed)
Patient Name:           Tara [name]   Date of Surgery:        10/03/2017  Pre op Diagnosis:      Abnormal mammogram right breast  Post op Diagnosis:    same  Procedure:                 Right breast lumpectomy with radioactive seed localization  Surgeon:                     Edsel Petrin. Dalbert Batman, M.D., FACS  Assistant:                      OR staff  Operative Indications:   This is a 72 year old female who returns to see me because of an abnormal mammogram of the right breast. Her PCP is Dr. Raliegh Scarlet. She was referred by [name] at [facility].    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 Recent mammograms, September 02, 2017 showed architectural distortion in the right breast upper outer quadrant. Core biopsy upper-outer right breast shows no malignancy. Usual ductal hyperplasia. Fibrocystic change. [name] felt this was discordant and told the patient she need to do have this area removed. The patient is a little bit frustrated but inclined to go ahead with the surgery.      I told her that it would be a good idea for her to get a high risk evaluation and genetic testing possibly after we clarify her diagnosis     Family history reveals mother died of metastatic breast cancer at age 57. 2 years following diagnosis. Grandmother had colon cancer. She knows that this is most likely not cancer.  She wants to have this excised to clarify the diagnosis.     She will be scheduled for right breast lumpectomy with radioactive seed localization.    Operative Findings:       The original titanium marker clip and the radioactive seed were in the right breast, upper outer quadrant, almost 8 cm lateral to the nipple.  There is no palpable abnormality.  The specimen mammogram showed the marker clip and the radioactive seed in the center of the specimen.  Procedure in Detail:          Following  the induction of general LMA anesthesia the patient's right breast was prepped and draped in a sterile fashion.  Surgical timeout was performed.  Intravenous and about 3 given.  0.5% Marcaine with epinephrine was used as local infiltration anesthetic.     Using the neoprobe I isolated the area of concern and planned the incision.  This was a curvilinear, circumareolar incision in the upper outer quadrant.  Dissection was performed with electrocautery and the neoprobe.  The specimen was removed and marked with silk sutures and a 6 color ink kit.  Specimen mammogram looked good as described above.  Specimen was marked and sent to the lab.  Lumpectomy cavity was irrigated.  Hemostasis was excellent.  The walls of the lumpectomy cavity were marked with 5 metal clips.  The breast tissues were closed in 2 separate layers with interrupted 3-0 Vicryl sutures and the skin closed with a running subcuticular 4-0 Monocryl and Dermabond.  Breast binder was placed and patient taken to PACU in stable condition.  EBL 10 mL.  Counts correct.  Complications none.   Addendum: I  logged on to the [facility] website and reviewed her prescription medication history.   Haywood M. Ingram, M.D., FACS General and Minimally Invasive Surgery Breast and Colorectal Surgery  10/03/2017 8:20 AM  

## 2017-10-04 ENCOUNTER — Encounter (HOSPITAL_BASED_OUTPATIENT_CLINIC_OR_DEPARTMENT_OTHER): Payer: Self-pay | Admitting: General Surgery

## 2017-10-10 ENCOUNTER — Ambulatory Visit (INDEPENDENT_AMBULATORY_CARE_PROVIDER_SITE_OTHER): Payer: Medicare Other | Admitting: Family Medicine

## 2017-10-10 ENCOUNTER — Encounter: Payer: Self-pay | Admitting: Family Medicine

## 2017-10-10 VITALS — BP 138/69 | HR 68 | Resp 16 | Wt 186.0 lb

## 2017-10-10 DIAGNOSIS — G2581 Restless legs syndrome: Secondary | ICD-10-CM | POA: Diagnosis not present

## 2017-10-10 DIAGNOSIS — R928 Other abnormal and inconclusive findings on diagnostic imaging of breast: Secondary | ICD-10-CM | POA: Diagnosis not present

## 2017-10-10 DIAGNOSIS — F321 Major depressive disorder, single episode, moderate: Secondary | ICD-10-CM | POA: Diagnosis not present

## 2017-10-10 DIAGNOSIS — F411 Generalized anxiety disorder: Secondary | ICD-10-CM

## 2017-10-10 DIAGNOSIS — D229 Melanocytic nevi, unspecified: Secondary | ICD-10-CM

## 2017-10-10 DIAGNOSIS — K219 Gastro-esophageal reflux disease without esophagitis: Secondary | ICD-10-CM | POA: Insufficient documentation

## 2017-10-10 DIAGNOSIS — E782 Mixed hyperlipidemia: Secondary | ICD-10-CM

## 2017-10-10 DIAGNOSIS — I10 Essential (primary) hypertension: Secondary | ICD-10-CM | POA: Diagnosis not present

## 2017-10-10 MED ORDER — ESOMEPRAZOLE MAGNESIUM 20 MG PO CPDR: 20.0000 mg | DELAYED_RELEASE_CAPSULE | Freq: Every day | ORAL | 1 refills | Status: AC

## 2017-10-10 MED ORDER — ESOMEPRAZOLE MAGNESIUM 20 MG PO CPDR: 20.0000 mg | DELAYED_RELEASE_CAPSULE | Freq: Every day | ORAL | 1 refills | Status: DC

## 2017-10-10 MED ORDER — ATORVASTATIN CALCIUM 20 MG PO TABS: 20.0000 mg | ORAL_TABLET | Freq: Every day | ORAL | 1 refills | Status: AC

## 2017-10-10 MED ORDER — OMEGA-3-ACID ETHYL ESTERS 1 G PO CAPS: 2.0000 g | ORAL_CAPSULE | Freq: Two times a day (BID) | ORAL | 3 refills | Status: AC

## 2017-10-10 MED ORDER — BUPROPION HCL ER (XL) 150 MG PO TB24: 300.0000 mg | ORAL_TABLET | Freq: Every day | ORAL | 1 refills | Status: DC

## 2017-10-10 MED ORDER — METOPROLOL SUCCINATE ER 100 MG PO TB24: 100.0000 mg | ORAL_TABLET | Freq: Every day | ORAL | 1 refills | Status: DC

## 2017-10-10 MED ORDER — AMITRIPTYLINE HCL 25 MG PO TABS: 50.0000 mg | ORAL_TABLET | Freq: Every evening | ORAL | 0 refills | Status: AC | PRN

## 2017-10-10 NOTE — Progress Notes (Signed)
Assessment and plan:  1. Mixed hyperlipidemia   2. Hypertension, unspecified type   3. GAD (generalized anxiety disorder)   4. Depression, major, single episode, moderate ([name])   5. Abnormal mammogram of left breast   6. Gastroesophageal reflux disease, esophagitis presence not specified   7. Multiple atypical nevi   8. Restless leg syndrome     1. Mixed Hyperlipidemia:   -Increase atorvastatin to 49m qhs because of her h/o myalgias with 149m Call the office and let usKoreanow if your symptoms worsen. -Start lovaza- pt prefers this instead of increasing her fish oil. -Recheck in 6 weeks.  -ASCVD 10 year risk today is 16.6%.  -AHA dietary and exercise guidelines discussed with patient.  -Reduce intake of saturated, trans fats and fatty carbohydrates.  -Pt reports poor compliance with her fish oil supplements. Pt instructed to take her supplements regularly as written below.  -Handouts provided.  2. Hypertension- BP well-controlled in office today. Pt asymptomatic. Continue meds as listed below. Keep a log and bring this into next OV.  3. GAD-  -Exercise more regularly.  -continue meds as listed below.  4. Depression, major, single episode -recommend getting a walking group, joining silver sneakers, or joining a [employer] to meet people to exercise regularly with.  -continue meds as listed below.   5. Abnormal mammogram of L breast- pt had breast lumpectomy recently with benign results.   6. GERD -refill meds.   7. Multiple atypical nevi -Ambulatory referral given to dermatology.   8. Restless leg syndrome- -start elavil. Follow up in 6 weeks after starting this.     Vitamin D  -Increase to 5000 OTC IUs daily. Goal 50-60 ng/mL range. -Last blood work result on 08-30-17 was 31.4 ng/mL. -Recheck in 4 months.   -Drink adequate amounts of water, equal to one half of your weight in oz per day.   -Walk regularly,  10-15 minutes day and increase this over time, up to 30-40 minutes per day.     -Goal: walk up to 10,000 steps daily.    Education and routine counseling performed. Handouts provided.  Orders Placed This Encounter  Procedures  . ALT  . Ambulatory referral to Dermatology    Meds ordered this encounter  Medications  . buPROPion (WELLBUTRIN XL) 150 MG 24 hr tablet    Sig: Take 2 tablets (300 mg total) by mouth daily.    Dispense:  180 tablet    Refill:  1  . atorvastatin (LIPITOR) 20 MG tablet    Sig: Take 1 tablet (20 mg total) by mouth at bedtime.    Dispense:  90 tablet    Refill:  1  . omega-3 acid ethyl esters (LOVAZA) 1 g capsule    Sig: Take 2 capsules (2 g total) by mouth 2 (two) times daily.    Dispense:  180 capsule    Refill:  3  . DISCONTD: esomeprazole (NEXIUM) 20 MG capsule    Sig: Take 1 capsule (20 mg total) by mouth daily at 12 noon.    Dispense:  90 capsule    Refill:  1  . metoprolol succinate (TOPROL-XL) 100 MG 24 hr tablet    Sig: Take 1 tablet (100 mg total) by mouth daily.    Dispense:  90 tablet    Refill:  1  . amitriptyline (ELAVIL) 25 MG tablet    Sig: Take 2 tablets (50 mg total) by mouth at bedtime as needed for sleep (Restless Legs).  Dispense:  90 tablet    Refill:  0  . esomeprazole (NEXIUM) 20 MG capsule    Sig: Take 1 capsule (20 mg total) by mouth daily at 12 noon.    Dispense:  90 capsule    Refill:  1     Return for 6 wks- Reck ALT, check starting elavil, inc lipitor.   Anticipatory guidance and routine counseling done re: condition, txmnt options and need for follow up. All questions of patient's were answered.   Gross side effects, risk and benefits, and alternatives of medications discussed with patient.  Patient is aware that all medications have potential side effects and we are unable to predict every sideeffect or drug-drug interaction that may occur.  Expresses verbal understanding and consents to current therapy plan  and treatment regiment.  Pt was in the office today for 40+ minutes, with over 50% time spent in face to face counseling of patients various medical conditions, treatment plans of those medical conditions including medicine management and lifestyle modification, strategies to improve health and well being; and in coordination of care. SEE ABOVE FOR DETAILS  Please see AVS handed out to patient at the end of our visit for additional patient instructions/ counseling done pertaining to today's office visit.  Note: This document was prepared using Dragon voice recognition software and may include unintentional dictation errors.  This document serves as a record of services personally performed by Mellody Dance, DO. It was created on her behalf by Mayer Masker, a trained medical scribe. The creation of this record is based on the scribe's personal observations and the provider's statements to them.   I have reviewed the above medical documentation for accuracy and completeness and I concur.  Mellody Dance 10/10/17 5:11 PM  -----------------------------------------------------------------------------------------------------   Subjective:   CC:   Tara [name] is a 72 y.o. female who presents to [place] [facility] today for review and discussion of recent bloodwork that was done.  1. All recent blood work that we ordered was reviewed with patient today.  Patient was counseled on all abnormalities and we discussed dietary and lifestyle changes that could help those values (also medications when appropriate).  Extensive health counseling performed and all patient's concerns/ questions were addressed.  Vitamin D  She states she takes 5000 IUs, but not daily.  She only drinks water and tries to drink a lot of it.  She also reports having a breast lumpectomy on 09-28-17. The results were benign.   She is not taking her CoQ10 and doesn't regularly take her fish oil  supplements.   She has not been able to walk regularly due to not being able to play golf with her husband. She normally walks around 3,000 and 4,000 steps per day.   She states her husband hurt his knee recently and she has to spend more time with him recently.   Skin Pt has not had a yearly skin screening in many years and is requesting referral to dermatology near her house.  CHOL HPI:   -  She  is currently managed with:  See med list from today  Txmnt compliance- sporadic- pt takes her cholesterol meds regularly, but does not take her fish oils regularly.   Patient reports very little compliance with low chol/ saturated and trans fat diet.  No exercise   Muscle aches- yes, pt reports feeling achy after taking this.   No other s-e  Last lipid panel as follows:  Lab  Results  Component Value Date   CHOL 251 (H) 08/30/2017   HDL 66 08/30/2017   LDLCALC 142 (H) 08/30/2017   TRIG 214 (H) 08/30/2017   CHOLHDL 3.8 08/30/2017    Hepatic Function Latest Ref Rng & Units 09/28/2017 08/30/2017 03/22/2017  Total Protein 6.5 - 8.1 g/dL 6.1(L) 7.2 -  Albumin 3.5 - 5.0 g/dL 3.8 5.0(H) -  AST 15 - 41 U/L _0 ALT 14 - 54 U/L _1 Alk Phosphatase 38 - 126 U/L 56 83 74  Total Bilirubin 0.3 - 1.2 mg/dL 0.9 0.6 -     Wt Readings from Last 3 Encounters:  10/10/17 186 lb (84.4 kg)  10/03/17 190 lb 2 oz (86.2 kg)  08/30/17 190 lb 6.4 oz (86.4 kg)   BP Readings from Last 3 Encounters:  10/10/17 138/69  10/03/17 (!) 161/70  08/30/17 136/82   Pulse Readings from Last 3 Encounters:  10/10/17 68  10/03/17 99  08/30/17 60   BMI Readings from Last 3 Encounters:  10/10/17 33.48 kg/m  10/03/17 34.22 kg/m  08/30/17 34.00 kg/m     Patient Care Team    Relationship Specialty Notifications Start End  Mellody Dance, DO PCP - General Family Medicine  08/30/17   Melina Schools, MD Consulting Physician Orthopedic Surgery  08/30/17    Comment: back pain  Clarene Essex, MD  Consulting Physician Gastroenterology  08/30/17   Danella Sensing, MD Consulting Physician Dermatology  08/30/17   Bjorn Loser, MD Consulting Physician Urology  08/30/17   Rutherford Guys, MD Consulting Physician Ophthalmology  08/30/17   Berneda Rose, DO    08/30/17   Fanny Skates, MD Consulting Physician General Surgery  08/30/17     Full medical history updated and reviewed in the office today  Patient Active Problem List   Diagnosis Date Noted  . Hypertension 08/30/2017    Priority: High  . Mixed hyperlipidemia 12/12/2009    Priority: High  . History of smoking 10-25 pack years- 20pk yr hx- quit age 31 08/30/2017    Priority: Low  . GERD (gastroesophageal reflux disease) 10/10/2017  . GAD (generalized anxiety disorder) 08/30/2017  . Depression, major, single episode, moderate ([place]) 08/30/2017  . Restless leg syndrome 08/30/2017  . Abnormal mammogram of left breast 04/20/2016  . Fatigue 12/12/2009  . SNORING 12/12/2009    Past Medical History:  Diagnosis Date  . Abnormal mammogram of left breast 04/20/2016  . Anxiety   . Borderline high cholesterol   . Breast mass, right   . GERD (gastroesophageal reflux disease)   . Hyperlipidemia   . Hypertension   . Restless leg syndrome     Past Surgical History:  Procedure Laterality Date  . ABDOMINAL HYSTERECTOMY    . BREAST BIOPSY Left 03/15/2016  . BREAST EXCISIONAL BIOPSY Left 04/20/2016  . BREAST EXCISIONAL BIOPSY Left 1995  . BREAST LUMPECTOMY WITH RADIOACTIVE SEED LOCALIZATION Left 04/20/2016   Procedure: LEFT BREAST LUMPECTOMY WITH RADIOACTIVE SEED LOCALIZATION;  Surgeon: Fanny Skates, MD;  Location: [facility];  Service: General;  Laterality: Left;  . BREAST LUMPECTOMY WITH RADIOACTIVE SEED LOCALIZATION Right 10/03/2017   Procedure: BREAST LUMPECTOMY WITH RADIOACTIVE SEED LOCALIZATION;  Surgeon: Fanny Skates, MD;  Location: [facility];  Service: General;  Laterality: Right;  .  TONSILLECTOMY      Social History   Tobacco Use  . Smoking status: Former Smoker    Years: 20.00    Types: Cigarettes    Last attempt  to quit: 1988    Years since quitting: 31.2  . Smokeless tobacco: Never Used  Substance Use Topics  . Alcohol use: Yes    Alcohol/week: 6.0 oz    Types: 10 Standard drinks or equivalent per week    Comment: social    Family Hx: Family History  Problem Relation Age of Onset  . Cancer Mother        breast  . Cancer Father        skin  . Hyperlipidemia Father   . Hypertension Father   . Parkinson's disease Father   . Alcohol abuse Brother   . Depression Brother   . Hyperlipidemia Brother   . Heart disease Maternal Grandmother   . Diabetes Maternal Grandmother      Medications: Current Outpatient Medications  Medication Sig Dispense Refill  . acetaminophen (ACETAMINOPHEN 8 HOUR) 650 MG CR tablet Take 650 mg by mouth every 8 (eight) hours as needed for pain.    Marland Kitchen buPROPion (WELLBUTRIN XL) 150 MG 24 hr tablet Take 2 tablets (300 mg total) by mouth daily. 180 tablet 1  . Cholecalciferol (VITAMIN D3) 5000 units CAPS Take 1 capsule by mouth.    . Coenzyme Q10 (COQ10 PO) Take 1 tablet by mouth daily.    Mariane Baumgarten Calcium (STOOL SOFTENER PO) Take 1 tablet by mouth daily.    Marland Kitchen esomeprazole (NEXIUM) 20 MG capsule Take 1 capsule (20 mg total) by mouth daily at 12 noon. 90 capsule 1  . ibuprofen (ADVIL,MOTRIN) 800 MG tablet Take 800 mg by mouth every 8 (eight) hours as needed.    . loratadine (CLARITIN) 10 MG tablet Take 10 mg by mouth daily.    . metoprolol succinate (TOPROL-XL) 100 MG 24 hr tablet Take 1 tablet (100 mg total) by mouth daily. 90 tablet 1  . Multiple Vitamin (MULTIVITAMIN) capsule Take 1 capsule by mouth daily.    . Pumpkin Seed-Soy Germ (AZO BLADDER CONTROL/GO-LESS PO) Take by mouth.    . Simethicone (GAS-X PO) Take 1 tablet by mouth daily.    . SUPER B COMPLEX/C PO Take 1 capsule by mouth daily.    Marland Kitchen amitriptyline (ELAVIL) 25  MG tablet Take 2 tablets (50 mg total) by mouth at bedtime as needed for sleep (Restless Legs). 90 tablet 0  . atorvastatin (LIPITOR) 20 MG tablet Take 1 tablet (20 mg total) by mouth at bedtime. 90 tablet 1  . omega-3 acid ethyl esters (LOVAZA) 1 g capsule Take 2 capsules (2 g total) by mouth 2 (two) times daily. 180 capsule 3   No current facility-administered medications for this visit.     Allergies:  Allergies  Allergen Reactions  . Latex Rash     Review of Systems: General:   No F/C, wt loss Pulm:   No DIB, SOB, pleuritic chest pain Card:  No CP, palpitations Abd:  No n/v/d or pain Ext:  No inc edema from baseline  Objective:  Blood pressure 138/69, pulse 68, resp. rate 16, weight 186 lb (84.4 kg), SpO2 95 %. Body mass index is 33.48 kg/m. Gen:   Well NAD, A and O *3 HEENT:    /AT, EOMI,  MMM Lungs:   Normal work of breathing. CTA B/L, no Wh, rhonchi Heart:   RRR, S1, S2 WNL's, no MRG Abd:   No gross distention Exts:    warm, pink,  Brisk capillary refill, warm and well perfused.  Psych:    No HI/SI, judgement and insight good, Euthymic mood. Full  Affect.   Recent Results (from the past 2160 hour(s))  CBC with Differential/Platelet     Status: None   Collection Time: 08/30/17 11:24 AM  Result Value Ref Range   WBC 6.8 3.4 - 10.8 x10E3/uL   RBC 4.74 3.77 - 5.28 x10E6/uL   Hemoglobin 14.1 11.1 - 15.9 g/dL   Hematocrit 42.5 34.0 - 46.6 %   MCV 90 79 - 97 fL   MCH 29.7 26.6 - 33.0 pg   MCHC 33.2 31.5 - 35.7 g/dL   RDW 13.8 12.3 - 15.4 %   Platelets 237 150 - 379 x10E3/uL   Neutrophils 63 Not Estab. %   Lymphs 26 Not Estab. %   Monocytes 7 Not Estab. %   Eos 3 Not Estab. %   Basos 1 Not Estab. %   Neutrophils Absolute 4.3 1.4 - 7.0 x10E3/uL   Lymphocytes Absolute 1.8 0.7 - 3.1 x10E3/uL   Monocytes Absolute 0.5 0.1 - 0.9 x10E3/uL   EOS (ABSOLUTE) 0.2 0.0 - 0.4 x10E3/uL   Basophils Absolute 0.1 0.0 - 0.2 x10E3/uL   Immature Granulocytes 0 Not Estab. %    Immature Grans (Abs) 0.0 0.0 - 0.1 x10E3/uL  Comprehensive metabolic panel     Status: Abnormal   Collection Time: 08/30/17 11:24 AM  Result Value Ref Range   Glucose 107 (H) 65 - 99 mg/dL   BUN 15 8 - 27 mg/dL   Creatinine, Ser 0.92 0.57 - 1.00 mg/dL   GFR calc non Af Amer 63 >59 mL/min/1.73   GFR calc Af Amer 73 >59 mL/min/1.73   BUN/Creatinine Ratio 16 12 - 28   Sodium 143 134 - 144 mmol/L   Potassium 5.0 3.5 - 5.2 mmol/L   Chloride 102 96 - 106 mmol/L   CO2 23 20 - 29 mmol/L   Calcium 10.2 8.7 - 10.3 mg/dL   Total Protein 7.2 6.0 - 8.5 g/dL   Albumin 5.0 (H) 3.5 - 4.8 g/dL   Globulin, Total 2.2 1.5 - 4.5 g/dL   Albumin/Globulin Ratio 2.3 (H) 1.2 - 2.2   Bilirubin Total 0.6 0.0 - 1.2 mg/dL   Alkaline Phosphatase 83 39 - 117 IU/L   AST 19 0 - 40 IU/L   ALT 21 0 - 32 IU/L  Lipid panel     Status: Abnormal   Collection Time: 08/30/17 11:24 AM  Result Value Ref Range   Cholesterol, Total 251 (H) 100 - 199 mg/dL   Triglycerides 214 (H) 0 - 149 mg/dL   HDL 66 >39 mg/dL   VLDL Cholesterol Cal 43 (H) 5 - 40 mg/dL   LDL Calculated 142 (H) 0 - 99 mg/dL   Chol/HDL Ratio 3.8 0.0 - 4.4 ratio    Comment:                                   T. Chol/HDL Ratio                                             Men  Women                               1/2 Avg.Risk  3.4    3.3  Avg.Risk  5.0    4.4                                2X Avg.Risk  9.6    7.1                                3X Avg.Risk 23.4   11.0   Hemoglobin A1c     Status: None   Collection Time: 08/30/17 11:24 AM  Result Value Ref Range   Hgb A1c MFr Bld 5.4 4.8 - 5.6 %    Comment:          Prediabetes: 5.7 - 6.4          Diabetes: >6.4          Glycemic control for adults with diabetes: <7.0    Est. average glucose Bld gHb Est-mCnc 108 mg/dL  TSH     Status: None   Collection Time: 08/30/17 11:24 AM  Result Value Ref Range   TSH 1.870 0.450 - 4.500 uIU/mL  T4, free     Status: None    Collection Time: 08/30/17 11:24 AM  Result Value Ref Range   Free T4 1.32 0.82 - 1.77 ng/dL  VITAMIN D 25 Hydroxy (Vit-D Deficiency, Fractures)     Status: None   Collection Time: 08/30/17 11:24 AM  Result Value Ref Range   Vit D, 25-Hydroxy 31.4 30.0 - 100.0 ng/mL    Comment: Vitamin D deficiency has been defined by the [place] and an Endocrine Society practice guideline as a level of serum 25-OH vitamin D less than 20 ng/mL (1,2). The Endocrine Society went on to further define vitamin D insufficiency as a level between 21 and 29 ng/mL (2). 1. IOM (Institute of Medicine). 2010. Dietary reference    intakes for calcium and D. [place]: The    [employer]. 2. Holick MF, Binkley [place], Bischoff-Ferrari HA, et al.    Evaluation, treatment, and prevention of vitamin D    deficiency: an Endocrine Society clinical practice    guideline. JCEM. 2011 Jul; 96(7):1911-30.   CBC WITH DIFFERENTIAL     Status: None   Collection Time: 09/28/17 12:58 PM  Result Value Ref Range   WBC 5.2 4.0 - 10.5 K/uL   RBC 4.33 3.87 - 5.11 MIL/uL   Hemoglobin 12.9 12.0 - 15.0 g/dL   HCT 39.1 36.0 - 46.0 %   MCV 90.3 78.0 - 100.0 fL   MCH 29.8 26.0 - 34.0 pg   MCHC 33.0 30.0 - 36.0 g/dL   RDW 13.1 11.5 - 15.5 %   Platelets 178 150 - 400 K/uL   Neutrophils Relative % 52 %   Neutro Abs 2.7 1.7 - 7.7 K/uL   Lymphocytes Relative 35 %   Lymphs Abs 1.8 0.7 - 4.0 K/uL   Monocytes Relative 9 %   Monocytes Absolute 0.4 0.1 - 1.0 K/uL   Eosinophils Relative 3 %   Eosinophils Absolute 0.2 0.0 - 0.7 K/uL   Basophils Relative 1 %   Basophils Absolute 0.1 0.0 - 0.1 K/uL    Comment: Performed at [facility] [address] [place], [place] [address]  Comprehensive metabolic panel     Status: Abnormal   Collection Time: 09/28/17 12:58 PM  Result Value Ref Range   Sodium 139 135 - 145 mmol/L  Potassium 4.3 3.5 - 5.1 mmol/L   Chloride 108 101 - 111 mmol/L   CO2 23 22 - 32 mmol/L    Glucose, Bld 84 65 - 99 mg/dL   BUN 12 6 - 20 mg/dL   Creatinine, Ser 0.84 0.44 - 1.00 mg/dL   Calcium 9.0 8.9 - 10.3 mg/dL   Total Protein 6.1 (L) 6.5 - 8.1 g/dL   Albumin 3.8 3.5 - 5.0 g/dL   AST 19 15 - 41 U/L   ALT 23 14 - 54 U/L   Alkaline Phosphatase 56 38 - 126 U/L   Total Bilirubin 0.9 0.3 - 1.2 mg/dL   GFR calc non Af Amer >60 >60 mL/min   GFR calc Af Amer >60 >60 mL/min    Comment: (NOTE) The eGFR has been calculated using the CKD EPI equation. This calculation has not been validated in all clinical situations. eGFR's persistently <60 mL/min signify possible Chronic Kidney Disease.    Anion gap 8 5 - 15    Comment: Performed at [facility] [address] [place], [place] [address]

## 2017-10-10 NOTE — Patient Instructions (Addendum)
-Go to the [employer] on [address].   - Please realize, EXERCISE IS MEDICINE!  -  American Heart Association [facility]) guidelines for exercise : If you are in good health, without any medical conditions, you should engage in 150 minutes of moderate intensity aerobic activity per week.  This means you should be huffing and puffing throughout your workout.   Engaging in regular exercise will improve brain function and memory, as well as improve mood, boost immune system and help with weight management.  As well as the other, more well-known effects of exercise such as decreasing blood sugar levels, decreasing blood pressure,  and decreasing bad cholesterol levels/ increasing good cholesterol levels.     -  The AHA strongly endorses consumption of a diet that contains a variety of foods from all the food categories with an emphasis on fruits and vegetables; fat-free and low-fat dairy products; cereal and grain products; legumes and nuts; and fish, poultry, and/or extra lean meats.    Excessive food intake, especially of foods high in saturated and trans fats, sugar, and salt, should be avoided.    Adequate water intake of roughly 1/2 of your weight in pounds, should equal the ounces of water per day you should drink.  So for instance, if you're 200 pounds, that would be 100 ounces of water per day.         Mediterranean Diet  Why follow it? Research shows  Those who follow the Mediterranean diet have a reduced risk of heart disease   The diet is associated with a reduced incidence of Parkinson's and Alzheimer's diseases  People following the diet may have longer life expectancies and lower rates of chronic diseases   The Dietary Guidelines for Americans recommends the Mediterranean diet as an eating plan to promote health and prevent disease  What Is the Mediterranean Diet?   Healthy eating plan based on typical foods and recipes of Mediterranean-style cooking  The diet is  primarily a plant based diet; these foods should make up a majority of meals   Starches - Plant based foods should make up a majority of meals - They are an important sources of vitamins, minerals, energy, antioxidants, and fiber - Choose whole grains, foods high in fiber and minimally processed items  - Typical grain sources include wheat, oats, barley, corn, brown rice, bulgar, farro, millet, polenta, couscous  - Various types of beans include chickpeas, lentils, fava beans, black beans, white beans   Fruits  Veggies - Large quantities of antioxidant rich fruits & veggies; 6 or more servings  - Vegetables can be eaten raw or lightly drizzled with oil and cooked  - Vegetables common to the traditional Mediterranean Diet include: artichokes, arugula, beets, broccoli, brussel sprouts, cabbage, carrots, celery, collard greens, cucumbers, eggplant, kale, leeks, lemons, lettuce, mushrooms, okra, onions, peas, peppers, potatoes, pumpkin, radishes, rutabaga, shallots, spinach, sweet potatoes, turnips, zucchini - Fruits common to the Mediterranean Diet include: apples, apricots, avocados, cherries, clementines, dates, figs, grapefruits, grapes, melons, nectarines, oranges, peaches, pears, pomegranates, strawberries, tangerines  Fats - Replace butter and margarine with healthy oils, such as olive oil, canola oil, and tahini  - Limit nuts to no more than a handful a day  - Nuts include walnuts, almonds, pecans, pistachios, pine nuts  - Limit or avoid candied, honey roasted or heavily salted nuts - Olives are central to the Mediterranean diet - can be eaten whole or used in a variety of dishes  Meats Protein - Limiting red meat: no more than a few times a month - When eating red meat: choose lean cuts and keep the portion to the size of deck of cards - Eggs: approx. 0 to 4 times a week  - Fish and lean poultry: at least 2 a week  - Healthy protein sources include, chicken, [place], lean beef, lamb -  Increase intake of seafood such as tuna, salmon, trout, mackerel, shrimp, scallops - Avoid or limit high fat processed meats such as sausage and bacon  Dairy - Include moderate amounts of low fat dairy products  - Focus on healthy dairy such as fat free yogurt, skim milk, low or reduced fat cheese - Limit dairy products higher in fat such as whole or 2% milk, cheese, ice cream  Alcohol - Moderate amounts of red wine is ok  - No more than 5 oz daily for women (all ages) and men older than age 102  - No more than 10 oz of wine daily for men younger than 58  Other - Limit sweets and other desserts  - Use herbs and spices instead of salt to flavor foods  - Herbs and spices common to the traditional Mediterranean Diet include: basil, bay leaves, chives, cloves, cumin, fennel, garlic, lavender, marjoram, mint, oregano, parsley, pepper, rosemary, sage, savory, sumac, tarragon, thyme   Its not just a diet, its a lifestyle:   The Mediterranean diet includes lifestyle factors typical of those in the region   Foods, drinks and meals are best eaten with others and savored  Daily physical activity is important for overall good health  This could be strenuous exercise like running and aerobics  This could also be more leisurely activities such as walking, housework, yard-work, or taking the stairs  Moderation is the key; a balanced and healthy diet accommodates most foods and drinks  Consider portion sizes and frequency of consumption of certain foods   Meal Ideas & Options:   Breakfast:  o Whole wheat toast or whole wheat English muffins with peanut butter & hard boiled egg o Steel cut oats topped with apples & cinnamon and skim milk  o Fresh fruit: banana, strawberries, melon, berries, peaches  o Smoothies: strawberries, bananas, greek yogurt, peanut butter o Low fat greek yogurt with blueberries and granola  o Egg white omelet with spinach and mushrooms o Breakfast couscous: whole wheat  couscous, apricots, skim milk, cranberries   Sandwiches:  o Hummus and grilled vegetables (peppers, zucchini, squash) on whole wheat bread   o Grilled chicken on whole wheat pita with lettuce, tomatoes, cucumbers or tzatziki  o Tuna salad on whole wheat bread: tuna salad made with greek yogurt, olives, red peppers, capers, green onions o Garlic rosemary lamb pita: lamb sauted with garlic, rosemary, salt & pepper; add lettuce, cucumber, greek yogurt to pita - flavor with lemon juice and black pepper   Seafood:  o Mediterranean grilled salmon, seasoned with garlic, basil, parsley, lemon juice and black pepper o Shrimp, lemon, and spinach whole-grain pasta salad made with low fat greek yogurt  o Seared scallops with lemon orzo  o Seared tuna steaks seasoned salt, pepper, coriander topped with tomato mixture of olives, tomatoes, olive oil, minced garlic, parsley, green onions and cappers   Meats:  o Herbed greek chicken salad with kalamata olives, cucumber, feta  o Red bell peppers stuffed with spinach, bulgur, lean ground beef (or lentils) & topped with feta   o Kebabs: skewers of chicken, tomatoes, onions,  zucchini, squash  o [place] burgers: made with red onions, mint, dill, lemon juice, feta cheese topped with roasted red peppers  Vegetarian o Cucumber salad: cucumbers, artichoke hearts, celery, red onion, feta cheese, tossed in olive oil & lemon juice  o Hummus and whole grain pita points with a greek salad (lettuce, tomato, feta, olives, cucumbers, red onion) o Lentil soup with celery, carrots made with vegetable broth, garlic, salt and pepper  o Tabouli salad: parsley, bulgur, mint, scallions, cucumbers, tomato, radishes, lemon juice, olive oil, salt and pepper.   The quick and dirty--> lower triglyceride levels more through...  1) - Beware of bad fats: Cutting back on saturated fat (in red meat and full-fat dairy foods) and trans fats (in restaurant fried foods and commercially  prepared baked goods) can lower triglycerides.  2) - Go for good carbs: Easily digested carbohydrates (such as white bread, white rice, cornflakes, and sugary sodas) give triglycerides a definite boost.   3) - Eating whole grains and cutting back on soda can help control triglycerides.  4) - Check your alcohol use. In some people, alcohol dramatically boosts triglycerides. The only way to know if this is true for you is to avoid alcohol for a few weeks and have your triglycerides tested again.  5) - Go fish. Omega-3 fats in salmon, tuna, sardines, and other fatty fish can lower triglycerides. Having fish twice a week is fine.  6) - Aim for a healthy weight. If you are overweight, losing just 5% to 10% of your weight can help drive down triglycerides.  7) - Get moving. Exercise lowers triglycerides and boosts heart-healthy HDL cholesterol.  8) - quit smoking if you do  --> for more information, see below; or go to  www.heart.org  and do a search for desired topics   For those diagnosed with high triglycerides, its important to take action to lower your levels and improve your heart health.  Triglyceride is just a fancy word for fat -- the fat in our bodies is stored in the form of triglycerides. Triglycerides are found in foods and manufactured in our bodies.  Normal triglyceride levels are defined as less than 150 mg/dL; 150 to 199 is considered borderline high; 200 to 499 is high; and 500 or higher is officially called very high. To me, anything over 150 is a red flag indicating my patient needs to take immediate steps to get the situation under control.   What is the significance of high triglycerides? High triglyceride levels make blood thicker and stickier, which means that it is more likely to form clots. Studies have shown that triglyceride levels are associated with increased risks of cardiovascular disease and stroke -- in both men and women -- alone or in combination with other  risk factors (high triglycerides combined with high LDL cholesterol can be a particularly deadly combination). For example, in one ground-breaking study, high triglycerides alone increased the risk of cardiovascular disease by 14 percent in men, and by 72 percent in women. But when the test subjects also had low HDL cholesterol (thats the good cholesterol) and other risk factors, high triglycerides increased the risk of disease by 32 percent in men and 76 percent in women.   Fortunately, triglycerides can sometimes be controlled with several diet and lifestyle changes.    What Factors Can Increase Triglycerides? As with cholesterol, eating too much of the wrong kinds of fats will raise your blood triglycerides.  Therefore, its important to restrict the amounts of saturated  fats and trans fats you allow into your diet.  Triglyceride levels can also shoot up after eating foods that are high in carbohydrates or after drinking alcohol.  Thats why triglyceride blood tests require an overnight fast.  If you have elevated triglycerides, its especially important to avoid sugary and refined carbohydrates, including sugar, honey, and other sweeteners, soda and other sugary drinks, candy, baked goods, and anything made with white (refined or enriched) flour, including white bread, rolls, cereals, buns, pastries, regular pasta, and white rice.  Youll also want to limit dried fruit and fruit juice since theyre dense in simple sugar.  All of these low-quality carbs cause a sudden rise in insulin, which may lead to a spike in triglycerides.  Triglycerides can also become elevated as a reaction to having diabetes, hypothyroidism, or kidney disease. As with most other heart-related factors, being overweight and inactive also contribute to abnormal triglycerides. And unfortunately, some people have a genetic predisposition that causes them to manufacture way too much triglycerides on their own, no matter how carefully  they eat.     How Can You Lower Your Triglyceride Levels? If you are diagnosed with high triglycerides, its important to take action. There are several things you can do to help lower your triglyceride levels and improve your heart health:  --> Lose weight if you are overweight.  There is a clear correlation between obesity and high triglycerides -- the heavier people are, the higher their triglyceride levels are likely to be. The good news is that losing weight can significantly lower triglycerides. In a large study of individuals with type 2 diabetes, those assigned to the lifestyle intervention group -- which involved counseling, a low-calorie meal plan, and customized exercise program -- lost 8.6% of their body weight and lowered their triglyceride levels by more than 16%. If youre overweight, find a weight loss plan that works for you and commit to shedding the pounds and getting healthier.  --> Reduce the amount of saturated fat and trans fat in your diet.  Start by avoiding or dramatically limiting butter, cream cheese, lard, sour cream, doughnuts, cakes, cookies, candy bars, regular ice cream, fried foods, pizza, cheese sauce, cream-based sauces and salad dressings, high-fat meats (including fatty hamburgers, bologna, pepperoni, sausage, bacon, salami, pastrami, spareribs, and hot dogs), high-fat cuts of beef and pork, and whole-milk dairy products.   Other ways to cut back: Choose lean meats only (including skinless chicken and [place], lean beef, lean pork), fish, and reduced-fat or fat-free dairy products.   Experiment with adding whole soy foods to your diet. Although soy itself may not reduce risk of heart disease, it replaces hazardous animal fats with healthier proteins. Choose high-quality soy foods, such as tofu, tempeh, soy milk, and edamame (whole soybeans).  Always remove skin from poultry.  Prepare foods by baking, roasting, broiling, boiling, poaching, steaming,  grilling, or stir-frying in vegetable oil.  Most stick margarines contain trans fats, and trans fats are also found in some packaged baked goods, potato chips, snack foods, fried foods, and fast food that use or create hydrogenated oils.    (All food labels must now list the amount of trans fats, right after the amount of saturated fats -- good news for consumers. As a result, many food companies have now reformulated their products to be trans fat freemany, but not all! So its still just as important to read labels and make sure the packaged foods you buy dont contain trans fats.)  If you use margarine, purchase soft-tub margarine spreads that contain 0 grams trans fats and dont list any partially hydrogenated oils in the ingredients list. By substituting olive oil or vegetable oil for trans fats in just 2 percent of your daily calories, you can reduce your risk of heart disease by 53 percent.   There is no safe amount of trans fats, so try to keep them as far from your plate as possible.  -->  Avoid foods that are concentrated in sugar (even dried fruit and fruit juice). Sugary foods can elevate triglyceride levels in the blood, so keep them to a bare minimum.  --> Swap out refined carbohydrates for whole grains.  Refined carbohydrates -- like white rice, regular pasta, and anything made with white or enriched flour (including white bread, rolls, cereals, buns, and crackers) -- raise blood sugar and insulin levels more than fiber-rich whole grains. Higher insulin levels, in turn, can lead to a higher rise in triglycerides after a meal. So, make the switch to whole wheat bread, whole grain pasta, brown or wild rice, and whole grain versions of cereals, crackers, and other bread products. However, its important to know that individuals with high triglycerides should moderate even their intake of high-quality starches (since all starches raise blood sugar) -- I recommend 1 to 2 servings per  meal.  --> Cut way back on alcohol.  If you have high triglycerides, alcohol should be considered a rare treat -- if you indulge at all, since even small amounts of alcohol can dramatically increase triglyceride levels.  --> Incorporate omega-3 fats.  Heart-healthy fish oils are especially rich in omega-3 fatty acids. In multiple studies over the past two decades, people who ate diets high in omega-3s had 30 to 40 percent reductions in heart disease. Although we dont yet know why fish oil works so well, there are several possibilities. Omega-3s seem to reduce inflammation, reduce high blood pressure, decrease triglycerides, raise HDL cholesterol, and make blood thinner and less sticky so it is less likely to clot. Its as close to a food prescription for heart health as it gets. If you have high triglycerides, I recommend eating at least three servings of one of the omega-3-rich fish every week (fatty fish is the most concentrated food form of omega three fats). If you cannot manage to eat that much fish, speak with your physician about taking fish oil capsules, which offer similar benefits.The best foods for omega-3 fatty acids include wild salmon (fresh, canned), herring, mackerel (not king), sardines, anchovies, rainbow trout, and Pacific oysters. Non-fish sources of omega-3 fats include omega-3-fortified eggs, ground flaxseed, chia seeds, walnuts, butternuts (white walnuts), seaweed, walnut oil, canola oil, and soybeans.  --> Quit smoking.  Smoking causes inflammation, not just in your lungs, but throughout your body. Inflammation can contribute to atherosclerosis, blood clots, and risk of heart attack. Smoking makes all heart health indicators worse. If you have high cholesterol, high triglycerides, or high blood pressure, smoking magnifies the danger.  --> Become more physically active.  Even moderate exercise can help improve cholesterol, triglycerides, and blood pressure. Aerobic exercise seems  to be able to stop the sharp rise of triglycerides after eating, perhaps because of a decrease in the amount of triglyceride released by the liver, or because active muscle clears triglycerides out of the blood stream more quickly than inactive muscle. If you havent exercised regularly (or at all) for years, I recommend starting slowly, by walking at an easy pace for 15 minutes  a day. Then, as you feel more comfortable, increase the amount. Your ultimate goal should be at least 30 minutes of moderate physical activity, at least five days a week.   Guidelines for a Low Cholesterol, Low Saturated Fat Diet   Fats - Limit total intake of fats and oils. - Avoid butter, stick margarine, shortening, lard, palm and coconut oils. - Limit mayonnaise, salad dressings, gravies and sauces, unless they are homemade with low-fat ingredients. - Limit chocolate. - Choose low-fat and nonfat products, such as low-fat mayonnaise, low-fat or non-hydrogenated peanut butter, low-fat or fat-free salad dressings and nonfat gravy. - Use vegetable oil, such as canola or olive oil. - Look for margarine that does not contain trans fatty acids. - Use nuts in moderate amounts. - Read ingredient labels carefully to determine both amount and type of fat present in foods. Limit saturated and trans fats! - Avoid high-fat processed and convenience foods.  Meats and Meat Alternatives - Choose fish, chicken, [place] and lean meats. - Use dried beans, peas, lentils and tofu. - Limit egg yolks to three to four per week. - If you eat red meat, limit to no more than three servings per week and choose loin or round cuts. - Avoid fatty meats, such as bacon, sausage, franks, luncheon meats and ribs. - Avoid all organ meats, including liver.  Dairy - Choose nonfat or low-fat milk, yogurt and cottage cheese. - Most cheeses are high in fat. Choose cheeses made from non-fat milk, such as mozzarella and ricotta cheese. - Choose light or  fat-free cream cheese and sour cream. - Avoid cream and sauces made with cream.  Fruits and Vegetables - Eat a wide variety of fruits and vegetables. - Use lemon juice, vinegar or "mist" olive oil on vegetables. - Avoid adding sauces, fat or oil to vegetables.  Breads, Cereals and Grains - Choose whole-grain breads, cereals, pastas and rice. - Avoid high-fat snack foods, such as granola, cookies, pies, pastries, doughnuts and croissants.  Cooking Tips - Avoid deep fried foods. - Trim visible fat off meats and remove skin from poultry before cooking. - Bake, broil, boil, poach or roast poultry, fish and lean meats. - Drain and discard fat that drains out of meat as you cook it. - Add little or no fat to foods. - Use vegetable oil sprays to grease pans for cooking or baking. - Steam vegetables. - Use herbs or no-oil marinades to flavor foods.

## 2017-10-19 ENCOUNTER — Telehealth: Payer: Self-pay | Admitting: Family Medicine

## 2017-10-19 NOTE — Telephone Encounter (Signed)
Patient has been on 1/2 a tablet for years and knows that this works well for her.  Patient will continue this dose for now and we will follow up with her on Monday when Dr. Raliegh Scarlet is in the office. MPulliam, CMA/RT(R)

## 2017-10-19 NOTE — Telephone Encounter (Signed)
Called patient and left message to call back. MPulliam, CMA/RT(R)

## 2017-10-19 NOTE — Telephone Encounter (Signed)
Patient notified that Dr. Raliegh Scarlet is out of the office.  Patient will check and see if she has enough medication until Monday when Dr. Raliegh Scarlet is back in the office. MPulliam, CMA/RT(R)

## 2017-10-19 NOTE — Telephone Encounter (Signed)
Patient was given a prescription for amitriptyline and was told to take 2 at bedtime for RLS, she took 1 tab to start and said that she could barely function the next day and does not wish to continue. She has an old prescription of clonazepam that she is taking in the mean time at bedtime (she is taking a half of a 1 mg tab). She is hoping for a refill of the clonazepam as she wakes up feeling better on it. Please advise, if approved please send to [facility].

## 2017-10-19 NOTE — Telephone Encounter (Signed)
Patient is feeling like a weight on her when she takes the full 20 mg atorvastatin, so she is currently only taking a half tab daily. She would like to talk to someone clinical about if this is ok or if there is something else she should be doing

## 2017-10-27 ENCOUNTER — Telehealth: Payer: Self-pay | Admitting: Family Medicine

## 2017-10-27 ENCOUNTER — Telehealth: Payer: Self-pay

## 2017-10-27 NOTE — Telephone Encounter (Signed)
Patient would like to speak with Dr. Hershal Coria nurse about ongoing med issue, she can be reached on her mobile today

## 2017-10-27 NOTE — Telephone Encounter (Signed)
Patient called is states that is is not able to take the amitriptyline at night, caused her to be extremely tired and out of it the next day.  She states that it makes her unable to function. Patient went back to 1/2 tablet of the Klonopin that she was taken.  Patient is not requesting a refill.  Patient is also only taking 1/2 tablet of her atorvastatin due to it cause her to feel heavy, like a weight is on her.   I spoke with Dr. Raliegh Scarlet and she advised patient take 1/2 tablet of the amitriptyline earlier in the evening and see if this works. And to take full tablet of the atorvastatin, dink 1/2 weight in water and to walk daily, thus the feeling should only be shortly.   Patient states that she is not going to try this medications.  I advised the patient that it will be best to come in for an appointment to discuss medication changes.  Patient states that she will call back to make appointment. MPulliam, CMA/RT(R)

## 2017-10-28 NOTE — Telephone Encounter (Signed)
Called and spoke to patient please see note in chart. MPulliam, CMA/RT(R)  

## 2017-11-21 ENCOUNTER — Ambulatory Visit: Payer: Medicare Other | Admitting: Family Medicine

## 2017-12-08 DIAGNOSIS — L821 Other seborrheic keratosis: Secondary | ICD-10-CM | POA: Diagnosis not present

## 2017-12-08 DIAGNOSIS — W57XXXA Bitten or stung by nonvenomous insect and other nonvenomous arthropods, initial encounter: Secondary | ICD-10-CM | POA: Diagnosis not present

## 2017-12-08 DIAGNOSIS — L905 Scar conditions and fibrosis of skin: Secondary | ICD-10-CM | POA: Diagnosis not present

## 2017-12-08 DIAGNOSIS — D225 Melanocytic nevi of trunk: Secondary | ICD-10-CM | POA: Diagnosis not present

## 2017-12-08 DIAGNOSIS — D1801 Hemangioma of skin and subcutaneous tissue: Secondary | ICD-10-CM | POA: Diagnosis not present

## 2017-12-08 DIAGNOSIS — L719 Rosacea, unspecified: Secondary | ICD-10-CM | POA: Diagnosis not present

## 2017-12-08 DIAGNOSIS — L814 Other melanin hyperpigmentation: Secondary | ICD-10-CM | POA: Diagnosis not present

## 2017-12-15 ENCOUNTER — Telehealth: Payer: Self-pay | Admitting: Family Medicine

## 2017-12-15 DIAGNOSIS — F411 Generalized anxiety disorder: Secondary | ICD-10-CM

## 2017-12-15 DIAGNOSIS — F321 Major depressive disorder, single episode, moderate: Secondary | ICD-10-CM

## 2017-12-15 MED ORDER — BUPROPION HCL ER (XL) 150 MG PO TB24
300.0000 mg | ORAL_TABLET | Freq: Every day | ORAL | 0 refills | Status: AC
Start: 2017-12-15 — End: ?

## 2017-12-15 NOTE — Addendum Note (Signed)
Addended by: Fonnie Mu on: 12/15/2017 09:19 AM   Modules accepted: Orders

## 2017-12-15 NOTE — Telephone Encounter (Signed)
Buproprion RX sent to [facility] for #14 only per pt request.  Pt informed.  Charyl Bigger, CMA

## 2017-12-15 NOTE — Telephone Encounter (Signed)
Patient is going out of town today and is requesting a weeks worth of her Wellbutrin to be sent to [facility]. She requested a refill already to [facility] but they said it could take up to a week to arrive and she is currently out of this med. Please advise

## 2018-03-03 DIAGNOSIS — R202 Paresthesia of skin: Secondary | ICD-10-CM | POA: Diagnosis not present

## 2018-03-03 DIAGNOSIS — G5603 Carpal tunnel syndrome, bilateral upper limbs: Secondary | ICD-10-CM | POA: Diagnosis not present

## 2018-03-03 DIAGNOSIS — R7301 Impaired fasting glucose: Secondary | ICD-10-CM | POA: Diagnosis not present

## 2018-03-03 DIAGNOSIS — E785 Hyperlipidemia, unspecified: Secondary | ICD-10-CM | POA: Diagnosis not present

## 2018-04-26 DIAGNOSIS — Z79899 Other long term (current) drug therapy: Secondary | ICD-10-CM | POA: Diagnosis not present

## 2018-04-26 DIAGNOSIS — E785 Hyperlipidemia, unspecified: Secondary | ICD-10-CM | POA: Diagnosis not present

## 2018-05-09 DIAGNOSIS — G2581 Restless legs syndrome: Secondary | ICD-10-CM | POA: Diagnosis not present

## 2018-05-09 DIAGNOSIS — N3281 Overactive bladder: Secondary | ICD-10-CM | POA: Diagnosis not present

## 2018-05-09 DIAGNOSIS — I10 Essential (primary) hypertension: Secondary | ICD-10-CM | POA: Diagnosis not present

## 2018-05-09 DIAGNOSIS — Z23 Encounter for immunization: Secondary | ICD-10-CM | POA: Diagnosis not present

## 2018-05-09 DIAGNOSIS — E785 Hyperlipidemia, unspecified: Secondary | ICD-10-CM | POA: Diagnosis not present

## 2018-05-09 DIAGNOSIS — M5136 Other intervertebral disc degeneration, lumbar region: Secondary | ICD-10-CM | POA: Diagnosis not present

## 2018-05-09 DIAGNOSIS — Z0000 Encounter for general adult medical examination without abnormal findings: Secondary | ICD-10-CM | POA: Diagnosis not present

## 2018-05-09 DIAGNOSIS — F324 Major depressive disorder, single episode, in partial remission: Secondary | ICD-10-CM | POA: Diagnosis not present

## 2018-05-19 DIAGNOSIS — M545 Low back pain: Secondary | ICD-10-CM | POA: Diagnosis not present

## 2018-05-24 DIAGNOSIS — M545 Low back pain: Secondary | ICD-10-CM | POA: Diagnosis not present

## 2018-05-31 DIAGNOSIS — M9904 Segmental and somatic dysfunction of sacral region: Secondary | ICD-10-CM | POA: Diagnosis not present

## 2018-06-08 DIAGNOSIS — M5136 Other intervertebral disc degeneration, lumbar region: Secondary | ICD-10-CM | POA: Diagnosis not present

## 2018-07-04 DIAGNOSIS — R0602 Shortness of breath: Secondary | ICD-10-CM | POA: Diagnosis not present

## 2018-07-04 DIAGNOSIS — J209 Acute bronchitis, unspecified: Secondary | ICD-10-CM | POA: Diagnosis not present

## 2018-08-09 DIAGNOSIS — F324 Major depressive disorder, single episode, in partial remission: Secondary | ICD-10-CM | POA: Diagnosis not present

## 2018-08-09 DIAGNOSIS — I10 Essential (primary) hypertension: Secondary | ICD-10-CM | POA: Diagnosis not present

## 2018-08-09 DIAGNOSIS — F419 Anxiety disorder, unspecified: Secondary | ICD-10-CM | POA: Diagnosis not present

## 2018-08-09 DIAGNOSIS — N3281 Overactive bladder: Secondary | ICD-10-CM | POA: Diagnosis not present

## 2018-09-21 DIAGNOSIS — H2511 Age-related nuclear cataract, right eye: Secondary | ICD-10-CM | POA: Diagnosis not present

## 2018-09-21 DIAGNOSIS — Z961 Presence of intraocular lens: Secondary | ICD-10-CM | POA: Diagnosis not present

## 2018-10-30 ENCOUNTER — Other Ambulatory Visit: Payer: Self-pay | Admitting: Family Medicine

## 2018-10-30 DIAGNOSIS — I10 Essential (primary) hypertension: Secondary | ICD-10-CM

## 2018-11-13 ENCOUNTER — Telehealth: Payer: Self-pay | Admitting: Family Medicine

## 2018-11-13 ENCOUNTER — Encounter: Payer: Self-pay | Admitting: Family Medicine

## 2018-11-13 NOTE — Telephone Encounter (Signed)
Called pt to set up Provider required OV .  ---Forwarding message to Medical asst as an FYI.  --glh

## 2018-11-13 NOTE — Telephone Encounter (Signed)
-----   Message from Jerilee Field, [place] sent at 10/31/2018  1:12 PM EDT ----- Patient is due for follow up, please call the patient to make an appointment. 30 day supply of meds sent in.  Thanks. MPulliam, CMA/RT(R)

## 2018-12-08 DIAGNOSIS — E785 Hyperlipidemia, unspecified: Secondary | ICD-10-CM | POA: Diagnosis not present

## 2018-12-08 DIAGNOSIS — F324 Major depressive disorder, single episode, in partial remission: Secondary | ICD-10-CM | POA: Diagnosis not present

## 2018-12-08 DIAGNOSIS — I10 Essential (primary) hypertension: Secondary | ICD-10-CM | POA: Diagnosis not present

## 2018-12-08 DIAGNOSIS — N3281 Overactive bladder: Secondary | ICD-10-CM | POA: Diagnosis not present

## 2019-05-16 DIAGNOSIS — N3281 Overactive bladder: Secondary | ICD-10-CM | POA: Diagnosis not present

## 2019-05-16 DIAGNOSIS — Z0000 Encounter for general adult medical examination without abnormal findings: Secondary | ICD-10-CM | POA: Diagnosis not present

## 2019-05-16 DIAGNOSIS — Z23 Encounter for immunization: Secondary | ICD-10-CM | POA: Diagnosis not present

## 2019-05-16 DIAGNOSIS — M5136 Other intervertebral disc degeneration, lumbar region: Secondary | ICD-10-CM | POA: Diagnosis not present

## 2019-05-16 DIAGNOSIS — F4321 Adjustment disorder with depressed mood: Secondary | ICD-10-CM | POA: Diagnosis not present

## 2019-05-16 DIAGNOSIS — I10 Essential (primary) hypertension: Secondary | ICD-10-CM | POA: Diagnosis not present

## 2019-05-16 DIAGNOSIS — M8588 Other specified disorders of bone density and structure, other site: Secondary | ICD-10-CM | POA: Diagnosis not present

## 2019-05-16 DIAGNOSIS — G2581 Restless legs syndrome: Secondary | ICD-10-CM | POA: Diagnosis not present

## 2019-05-16 DIAGNOSIS — Z1389 Encounter for screening for other disorder: Secondary | ICD-10-CM | POA: Diagnosis not present

## 2019-05-16 DIAGNOSIS — E785 Hyperlipidemia, unspecified: Secondary | ICD-10-CM | POA: Diagnosis not present

## 2019-05-16 DIAGNOSIS — F324 Major depressive disorder, single episode, in partial remission: Secondary | ICD-10-CM | POA: Diagnosis not present

## 2019-07-15 IMAGING — [facility] MM PLC BREAST LOC DEV 1ST LESION INC*R*
6 series · 6 of 6 positions shown · non-contrast
Comparison: Previous exam(s).

CLINICAL DATA: Right breast distortion.

EXAM:
MAMMOGRAPHIC GUIDED RADIOACTIVE SEED LOCALIZATION OF THE RIGHT
BREAST

[R CC (1 of 3)]
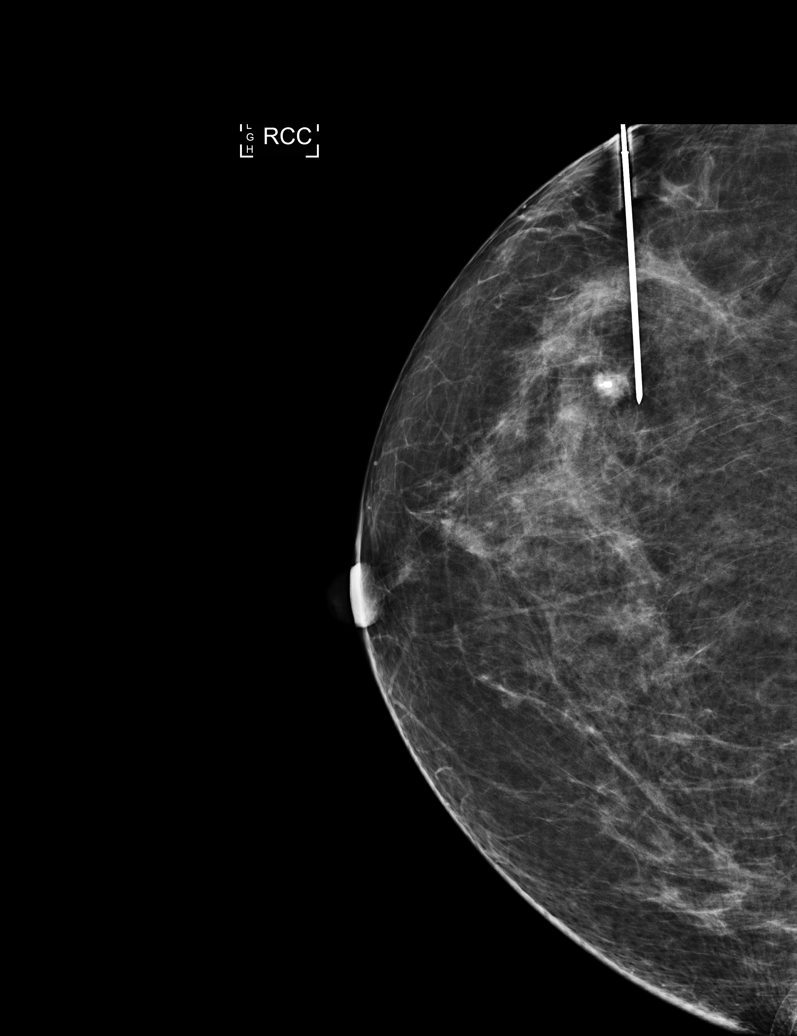

[R LM (1 of 2)]
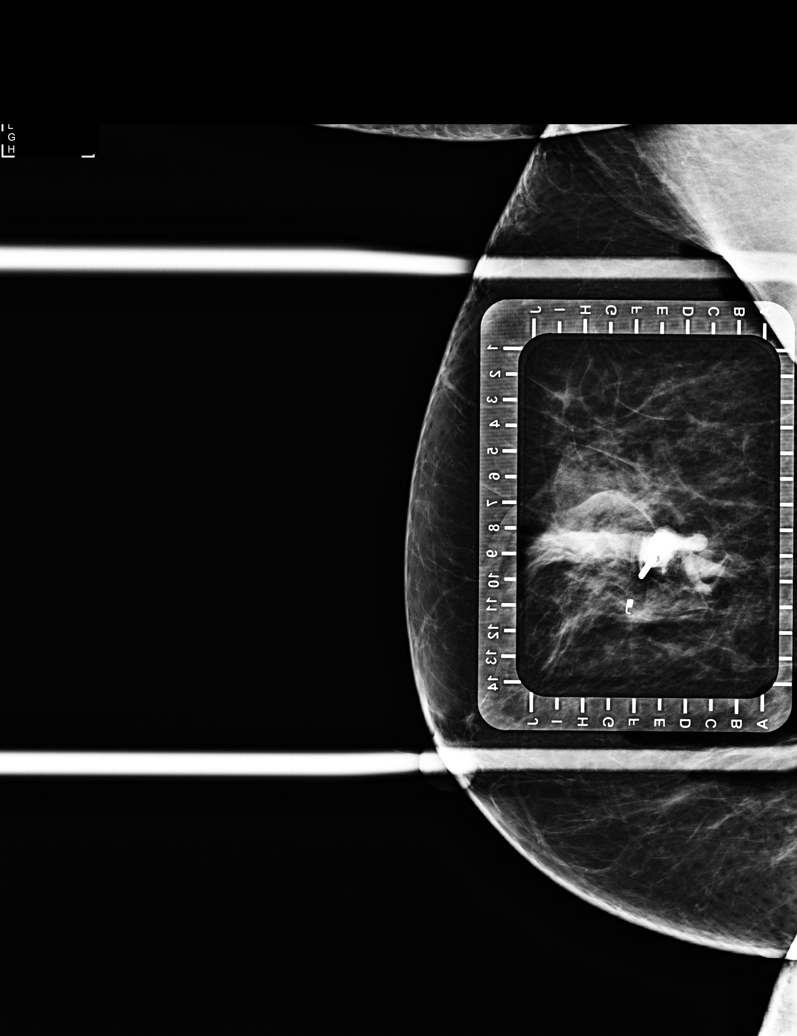

[R ML]
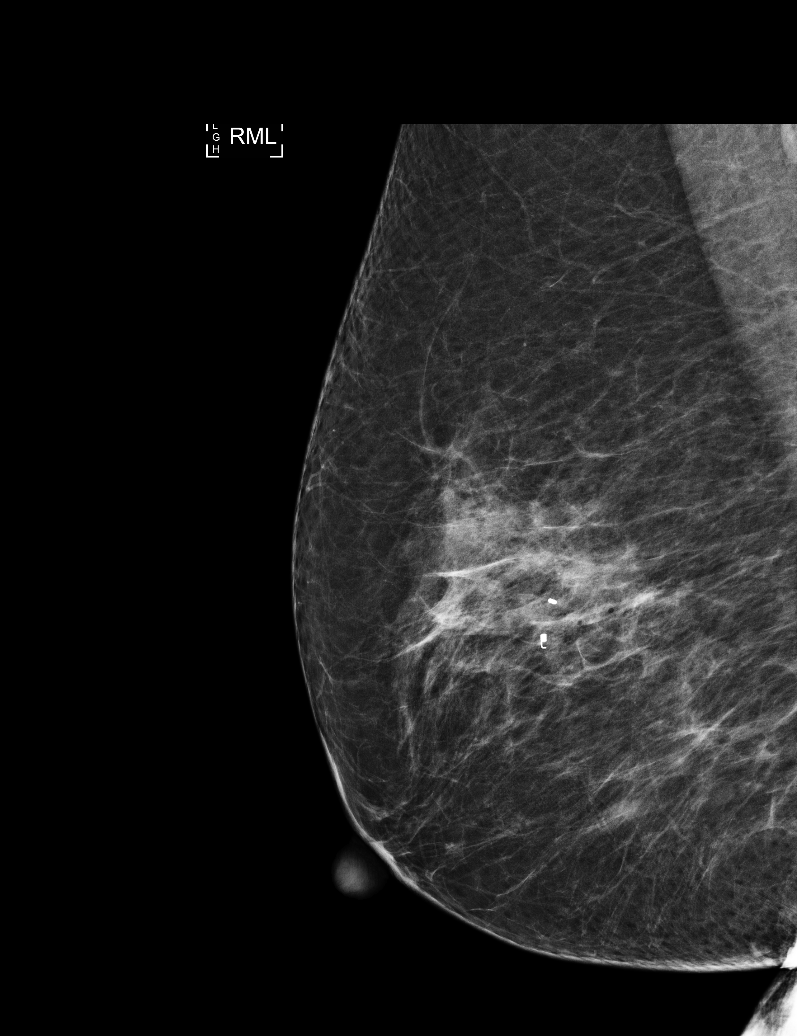

[R CC (2 of 3)]
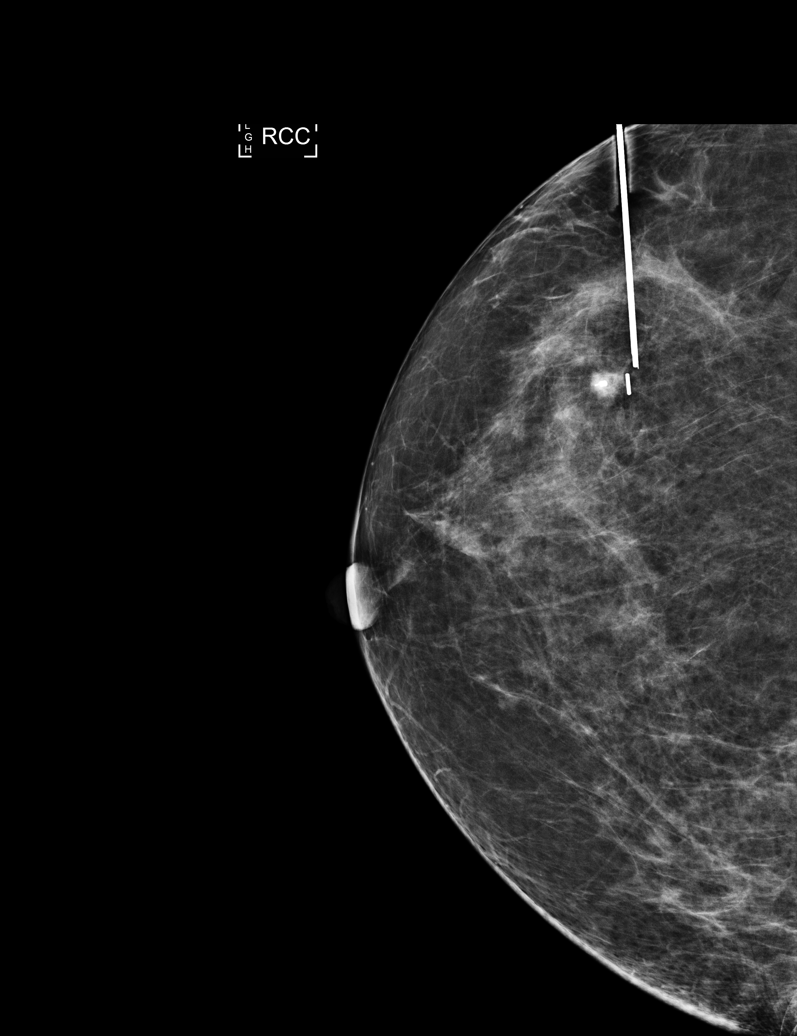

[R CC (3 of 3)]
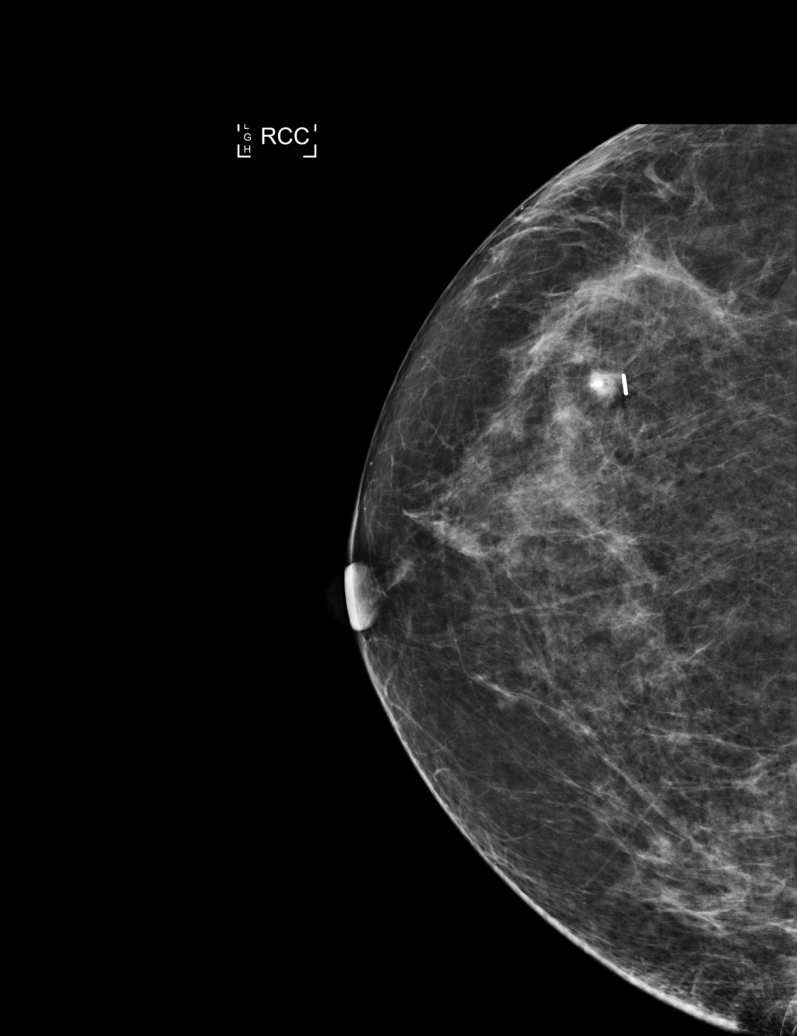

[R LM (2 of 2)]
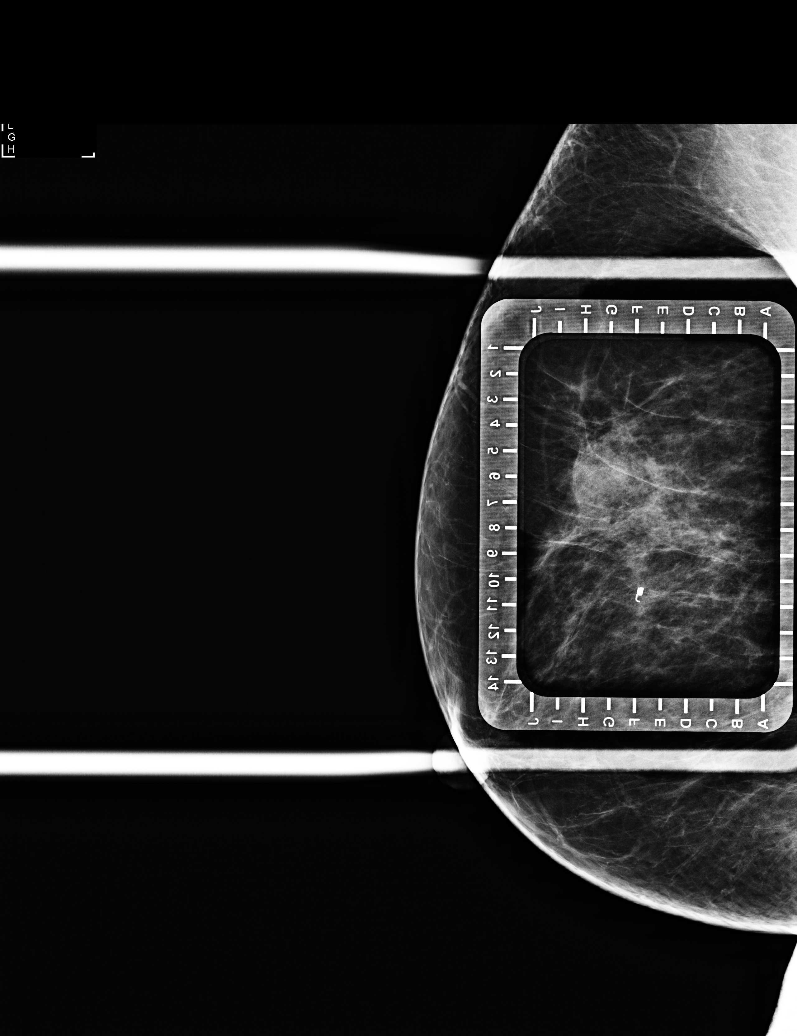

[6 of 6 positions shown; findings below may reference images not displayed]



The usual time-out protocol was performed immediately prior to the
procedure.

Using mammographic guidance, sterile technique, 1% lidocaine and an
[facility] radioactive seed, the distortion and coil shaped clip was
localized using a lateral to medial approach. The follow-up
mammogram images confirm the seed in the expected location and were
marked for Dr. Zulaini.

Follow-up survey of the patient confirms presence of the radioactive
seed.

Order number of [facility] seed:  [id number].

Total activity:  0.253 millicuries reference Date: 09/19/2017

The patient tolerated the procedure well and was released from the
[REDACTED]. She was given instructions regarding seed removal.
IMPRESSION: Radioactive seed localization right breast. No apparent
complications.

## 2019-07-18 IMAGING — [facility] BREAST SURGICAL SPECIMEN
1 series · 1 of 1 positions shown · non-contrast
Comparison: Previous exam(s).

CLINICAL DATA: Post excisional biopsy of the right breast.

EXAM:
SPECIMEN RADIOGRAPH OF THE RIGHT BREAST

[R]
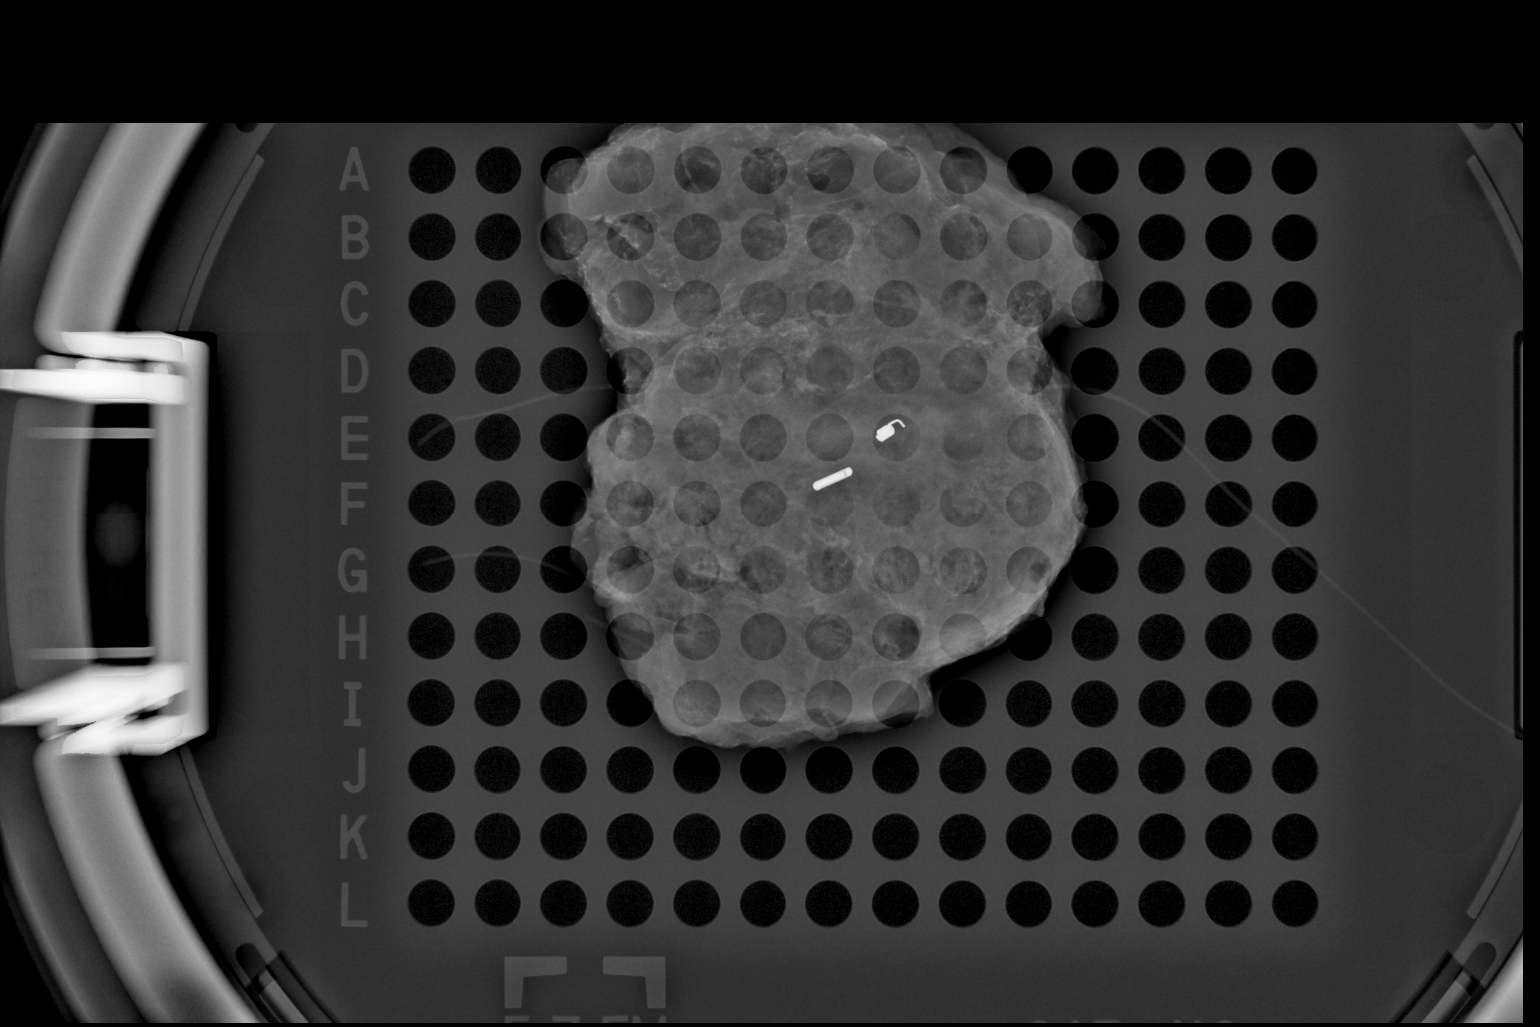

[1 of 1 positions shown; findings below may reference images not displayed]

FINDINGS: Status post excision of the right breast. The radioactive seed and
biopsy marker clip are present, completely intact, and were marked
for pathology.
IMPRESSION: Specimen radiograph of the right breast.

## 2019-08-24 DIAGNOSIS — M5137 Other intervertebral disc degeneration, lumbosacral region: Secondary | ICD-10-CM | POA: Diagnosis not present

## 2019-10-25 DIAGNOSIS — H25011 Cortical age-related cataract, right eye: Secondary | ICD-10-CM | POA: Diagnosis not present

## 2019-10-25 DIAGNOSIS — H2511 Age-related nuclear cataract, right eye: Secondary | ICD-10-CM | POA: Diagnosis not present

## 2019-10-25 DIAGNOSIS — B356 Tinea cruris: Secondary | ICD-10-CM | POA: Diagnosis not present

## 2019-10-25 DIAGNOSIS — Z961 Presence of intraocular lens: Secondary | ICD-10-CM | POA: Diagnosis not present

## 2019-11-14 DIAGNOSIS — W57XXXA Bitten or stung by nonvenomous insect and other nonvenomous arthropods, initial encounter: Secondary | ICD-10-CM | POA: Diagnosis not present

## 2019-11-14 DIAGNOSIS — I10 Essential (primary) hypertension: Secondary | ICD-10-CM | POA: Diagnosis not present

## 2019-11-14 DIAGNOSIS — R413 Other amnesia: Secondary | ICD-10-CM | POA: Diagnosis not present

## 2019-11-14 DIAGNOSIS — G2581 Restless legs syndrome: Secondary | ICD-10-CM | POA: Diagnosis not present

## 2019-11-14 DIAGNOSIS — N3281 Overactive bladder: Secondary | ICD-10-CM | POA: Diagnosis not present

## 2019-11-14 DIAGNOSIS — F324 Major depressive disorder, single episode, in partial remission: Secondary | ICD-10-CM | POA: Diagnosis not present

## 2019-11-14 DIAGNOSIS — E785 Hyperlipidemia, unspecified: Secondary | ICD-10-CM | POA: Diagnosis not present

## 2019-11-14 DIAGNOSIS — F419 Anxiety disorder, unspecified: Secondary | ICD-10-CM | POA: Diagnosis not present

## 2020-02-08 DIAGNOSIS — M9904 Segmental and somatic dysfunction of sacral region: Secondary | ICD-10-CM | POA: Diagnosis not present

## 2020-02-08 DIAGNOSIS — M5136 Other intervertebral disc degeneration, lumbar region: Secondary | ICD-10-CM | POA: Diagnosis not present

## 2020-03-03 DIAGNOSIS — F3341 Major depressive disorder, recurrent, in partial remission: Secondary | ICD-10-CM | POA: Diagnosis not present

## 2020-03-03 DIAGNOSIS — R197 Diarrhea, unspecified: Secondary | ICD-10-CM | POA: Diagnosis not present

## 2020-03-11 DIAGNOSIS — M5136 Other intervertebral disc degeneration, lumbar region: Secondary | ICD-10-CM | POA: Diagnosis not present

## 2020-03-28 DIAGNOSIS — N6459 Other signs and symptoms in breast: Secondary | ICD-10-CM | POA: Diagnosis not present

## 2020-03-28 DIAGNOSIS — Z711 Person with feared health complaint in whom no diagnosis is made: Secondary | ICD-10-CM | POA: Diagnosis not present

## 2020-03-28 DIAGNOSIS — N63 Unspecified lump in unspecified breast: Secondary | ICD-10-CM | POA: Diagnosis not present

## 2020-03-28 DIAGNOSIS — N3281 Overactive bladder: Secondary | ICD-10-CM | POA: Diagnosis not present

## 2020-04-29 DIAGNOSIS — Z23 Encounter for immunization: Secondary | ICD-10-CM | POA: Diagnosis not present
